# Patient Record
Sex: Male | Born: 1960 | Race: White | Hispanic: No | Marital: Married | Smoking: Former smoker
Health system: Southern US, Community
[De-identification: ages and names within clinical notes are randomized; demographics above are authoritative.]

## PROBLEM LIST (undated history)

## (undated) DIAGNOSIS — H269 Unspecified cataract: Secondary | ICD-10-CM

## (undated) DIAGNOSIS — R011 Cardiac murmur, unspecified: Secondary | ICD-10-CM

## (undated) DIAGNOSIS — K219 Gastro-esophageal reflux disease without esophagitis: Secondary | ICD-10-CM

## (undated) DIAGNOSIS — E785 Hyperlipidemia, unspecified: Secondary | ICD-10-CM

## (undated) DIAGNOSIS — A048 Other specified bacterial intestinal infections: Secondary | ICD-10-CM

## (undated) DIAGNOSIS — B019 Varicella without complication: Secondary | ICD-10-CM

## (undated) HISTORY — DX: Varicella without complication: B01.9

## (undated) HISTORY — PX: POLYPECTOMY: SHX149

## (undated) HISTORY — DX: Unspecified cataract: H26.9

## (undated) HISTORY — DX: Other specified bacterial intestinal infections: A04.8

## (undated) HISTORY — PX: KNEE ARTHROSCOPY: SUR90

## (undated) HISTORY — DX: Hyperlipidemia, unspecified: E78.5

## (undated) HISTORY — DX: Gastro-esophageal reflux disease without esophagitis: K21.9

## (undated) HISTORY — PX: COLONOSCOPY: SHX174

## (undated) HISTORY — DX: Cardiac murmur, unspecified: R01.1

---

## 2004-03-22 ENCOUNTER — Ambulatory Visit: Payer: Self-pay | Admitting: Family Medicine

## 2004-10-10 ENCOUNTER — Ambulatory Visit: Payer: Self-pay | Admitting: Family Medicine

## 2004-10-28 ENCOUNTER — Ambulatory Visit: Payer: Self-pay | Admitting: Family Medicine

## 2004-11-17 ENCOUNTER — Ambulatory Visit: Payer: Self-pay | Admitting: Pulmonary Disease

## 2006-04-25 ENCOUNTER — Ambulatory Visit: Payer: Self-pay | Admitting: Family Medicine

## 2006-04-25 LAB — CONVERTED CEMR LAB
ALT: 23 units/L (ref 0–40)
AST: 22 units/L (ref 0–37)
Basophils Relative: 0.7 % (ref 0.0–1.0)
Bilirubin, Direct: 0.1 mg/dL (ref 0.0–0.3)
CO2: 31 meq/L (ref 19–32)
Calcium: 9.6 mg/dL (ref 8.4–10.5)
Chloride: 104 meq/L (ref 96–112)
Creatinine, Ser: 1 mg/dL (ref 0.4–1.5)
Eosinophils Absolute: 0.1 10*3/uL (ref 0.0–0.6)
Eosinophils Relative: 2.1 % (ref 0.0–5.0)
GFR calc non Af Amer: 87 mL/min
Glucose, Bld: 97 mg/dL (ref 70–99)
HCT: 44.2 % (ref 39.0–52.0)
LDL Cholesterol: 110 mg/dL — ABNORMAL HIGH (ref 0–99)
MCV: 93.3 fL (ref 78.0–100.0)
Neutrophils Relative %: 41 % — ABNORMAL LOW (ref 43.0–77.0)
Platelets: 313 10*3/uL (ref 150–400)
RBC: 4.73 M/uL (ref 4.22–5.81)
RDW: 11.5 % (ref 11.5–14.6)
Sodium: 142 meq/L (ref 135–145)
TSH: 1.87 microintl units/mL (ref 0.35–5.50)
Total Bilirubin: 1.1 mg/dL (ref 0.3–1.2)
Total CHOL/HDL Ratio: 3.8
Triglycerides: 86 mg/dL (ref 0–149)
VLDL: 17 mg/dL (ref 0–40)
WBC: 5.5 10*3/uL (ref 4.5–10.5)

## 2006-05-02 ENCOUNTER — Ambulatory Visit: Payer: Self-pay | Admitting: Family Medicine

## 2006-08-21 ENCOUNTER — Ambulatory Visit: Payer: Self-pay | Admitting: Family Medicine

## 2007-04-02 ENCOUNTER — Telehealth: Payer: Self-pay | Admitting: Family Medicine

## 2007-09-09 ENCOUNTER — Telehealth: Payer: Self-pay | Admitting: Family Medicine

## 2007-12-02 ENCOUNTER — Telehealth: Payer: Self-pay | Admitting: Family Medicine

## 2008-01-20 ENCOUNTER — Ambulatory Visit: Payer: Self-pay | Admitting: Family Medicine

## 2008-01-20 LAB — CONVERTED CEMR LAB
Ketones, urine, test strip: NEGATIVE
Nitrite: NEGATIVE
Specific Gravity, Urine: 1.015
Urobilinogen, UA: 0.2
WBC Urine, dipstick: NEGATIVE

## 2008-01-22 LAB — CONVERTED CEMR LAB
ALT: 20 units/L (ref 0–53)
AST: 22 units/L (ref 0–37)
Alkaline Phosphatase: 39 units/L (ref 39–117)
BUN: 19 mg/dL (ref 6–23)
Bilirubin, Direct: 0.1 mg/dL (ref 0.0–0.3)
CO2: 29 meq/L (ref 19–32)
Chloride: 107 meq/L (ref 96–112)
Eosinophils Relative: 1.5 % (ref 0.0–5.0)
Glucose, Bld: 95 mg/dL (ref 70–99)
HDL: 50.7 mg/dL (ref >39.0)
LDL Cholesterol: 114 mg/dL — ABNORMAL HIGH (ref 0–99)
Lymphocytes Relative: 33.4 % (ref 12.0–46.0)
Monocytes Relative: 5.4 % (ref 3.0–12.0)
Platelets: 255 10*3/uL (ref 150–400)
Potassium: 5 meq/L (ref 3.5–5.1)
RDW: 11.9 % (ref 11.5–14.6)
Sodium: 143 meq/L (ref 135–145)
Total CHOL/HDL Ratio: 3.4
Total Protein: 6.9 g/dL (ref 6.0–8.3)
Triglycerides: 44 mg/dL (ref 0–149)
WBC: 6 10*3/uL (ref 4.5–10.5)

## 2008-01-27 ENCOUNTER — Ambulatory Visit: Payer: Self-pay | Admitting: Family Medicine

## 2008-01-27 DIAGNOSIS — K219 Gastro-esophageal reflux disease without esophagitis: Secondary | ICD-10-CM | POA: Insufficient documentation

## 2008-01-27 DIAGNOSIS — E785 Hyperlipidemia, unspecified: Secondary | ICD-10-CM | POA: Insufficient documentation

## 2008-01-27 DIAGNOSIS — Z8679 Personal history of other diseases of the circulatory system: Secondary | ICD-10-CM | POA: Insufficient documentation

## 2008-12-10 ENCOUNTER — Encounter: Payer: Self-pay | Admitting: Family Medicine

## 2010-07-29 NOTE — Assessment & Plan Note (Signed)
[facility] OFFICE NOTE   MELBOURNE, JAKUBIAK                     MRN:          [id number]  DATE:05/02/2006                            DOB:          02/14/1961    This is a 50 year old gentleman here for a complete physical exam.  He  has no particular complaints today, and is feeling well.  We treated him  for acid reflux disease in the past, and this is much better.  He has  changed his diet, and now only requires medication on an intermittent  basis.  About a year ago, we wondered if he may have some sleep apnea  due to excessive daytime sleepiness.  He saw the pulmonologist, and was  actually set up to have a sleep study, however, he had to cancel that,  and never actually rescheduled the sleep study.  However, he says he has  made some changes in his life, and has a lot less stress to deal with.  He now sleeps better at night, and has less sleepiness to deal with  during the daytime.   For other details of his past medical history, family history, social  history, habits, etc., refer to our last physical noted dated October 28, 2004.   ALLERGIES:  NONE.   CURRENT MEDICATIONS:  Protonix 40 mg once a day on an as-needed basis.   OBJECTIVE:  Height 5 feet 7 inches.  Weight 164.  BP 132/76.  Pulse 76  and regular.  GENERAL:  He appears to be healthy.  SKIN:  Clear.  Eyes are clear.  Ears are clear.  Pharynx clear.  NECK:  Supple without lymphadenopathy or masses.  LUNGS:  Clear.  CARDIAC:  Rate and rhythm regular without gallops, murmurs or rubs.  Distal pulses are full.  ABDOMEN:  Soft.  Normal bowel sounds.  Non-tender.  No masses.  GENITALIA:  Normal male.  EXTREMITIES:  No clubbing, cyanosis or edema.  NEUROLOGIC:  Exam is grossly intact.   He was here for fasting labs on April 25, 2006.  These were all  within normal limits except for his lipid panel.  LDL was mildly  elevated at 110, but  his HDL was excellent at 46.   ASSESSMENT AND PLAN:  1. Complete physical.  Encouraged him to get regular exercise.  2. Hyperlipidemia.  We talked about some dietary changes he could      make.  We will check this again in a year.  3. Gastroesophageal reflux disease.  Stable.    Tera Mater. Clent Ridges, MD  Electronically Signed   SAF/MedQ  DD: 05/02/2006  DT: 05/02/2006  Job #: [id number]

## 2012-06-03 ENCOUNTER — Encounter: Payer: Self-pay | Admitting: Family Medicine

## 2012-06-03 ENCOUNTER — Ambulatory Visit (INDEPENDENT_AMBULATORY_CARE_PROVIDER_SITE_OTHER): Payer: No Typology Code available for payment source | Admitting: Family Medicine

## 2012-06-03 VITALS — BP 130/70 | HR 80 | Temp 99.5°F | Resp 12 | Ht 66.5 in | Wt 167.0 lb

## 2012-06-03 DIAGNOSIS — R079 Chest pain, unspecified: Secondary | ICD-10-CM

## 2012-06-03 DIAGNOSIS — K219 Gastro-esophageal reflux disease without esophagitis: Secondary | ICD-10-CM

## 2012-06-03 MED ORDER — OMEPRAZOLE 40 MG PO CPDR: 40.0000 mg | DELAYED_RELEASE_CAPSULE | Freq: Every day | ORAL | Status: DC

## 2012-06-03 NOTE — Progress Notes (Signed)
  Subjective:    Patient ID: Darrell Romero, male    DOB: December 05, 1960, 52 y.o.   MRN: [id number]  HPI Patient here to establish care  Has not seen a physician several years. Recently had co-worker diagnosed with coronary artery disease in his 36s. Patient has noted for 6-8 months intermittent chest pains. These occasionally occur after exertion and sometimes at rest. Recently had one episode after climbing some stairs of chest pressure. Duration is usually one to 2 hours. Quality is frequently burning and severity is mild No radiation. Denies any associated dyspnea. No nausea. No vomiting. Has had some increased fatigue recently  Patient has history of GERD and years ago was treated with Protonix. He drinks approximately 2 pots of coffee per day Has recently taken TUMS without much relief. No dysphagia, appetite or weight change.  Nonsmoker. No family history of premature CAD. Mom apparently died of stroke complications at age 87. Previous lipids have been unremarkable. No history of diabetes or hypertension.  Past Medical History  Diagnosis Date  . GERD (gastroesophageal reflux disease)   . Hyperlipidemia   . Chicken pox    Past Surgical History  Procedure Laterality Date  . Knee arthroscopy  1998, 1999    left    reports that he has never smoked. He does not have any smokeless tobacco history on file. His alcohol and drug histories are not on file. family history includes Arthritis in his father; Heart disease in his paternal aunt; and Stroke (age of onset: 87) in his mother. Allergies  Allergen Reactions  . Protonix (Pantoprazole Sodium)        Review of Systems  Constitutional: Negative for appetite change and unexpected weight change.  HENT: Negative for trouble swallowing.   Respiratory: Negative for cough and shortness of breath.   Cardiovascular: Positive for chest pain. Negative for leg swelling.  Gastrointestinal: Negative for abdominal pain.  Neurological:  Negative for dizziness and syncope.  Hematological: Negative for adenopathy.       Objective:   Physical Exam  Constitutional: He appears well-developed and well-nourished.  Neck: Neck supple. No thyromegaly present.  Cardiovascular: Normal rate and regular rhythm.  Exam reveals no gallop.   No murmur heard. Pulmonary/Chest: Effort normal and breath sounds normal. No respiratory distress. He has no wheezes. He has no rales.  Abdominal: Soft. Bowel sounds are normal. He exhibits no distension and no mass. There is no tenderness. There is no rebound and no guarding.  Musculoskeletal: He exhibits no edema.          Assessment & Plan:  Chest pain. Suspect probably GERD related. Given recent exacerbation occasionally with exercise needs further evaluation. Check baseline EKG. Set up exercise stress test. Start back Protonix 40 mg daily.  Discussed need to gradually scale back caffeine (currently estimated 2 POTS/day)  EKG reveals sinus rhythm with no acute changes. Set up exercise tolerance test. Gradually scale back caffeine use

## 2012-06-03 NOTE — Patient Instructions (Addendum)
Diet for Gastroesophageal Reflux Disease, Adult Reflux (acid reflux) is when acid from your stomach flows up into the esophagus. When acid comes in contact with the esophagus, the acid causes irritation and soreness (inflammation) in the esophagus. When reflux happens often or so severely that it causes damage to the esophagus, it is called gastroesophageal reflux disease (GERD). Nutrition therapy can help ease the discomfort of GERD. FOODS OR DRINKS TO AVOID OR LIMIT  Smoking or chewing tobacco. Nicotine is one of the most potent stimulants to acid production in the gastrointestinal tract.  Caffeinated and decaffeinated coffee and black tea.  Regular or low-calorie carbonated beverages or energy drinks (caffeine-free carbonated beverages are allowed).   Strong spices, such as black pepper, white pepper, red pepper, cayenne, curry powder, and chili powder.  Peppermint or spearmint.  Chocolate.  High-fat foods, including meats and fried foods. Extra added fats including oils, butter, salad dressings, and nuts. Limit these to less than 8 tsp per day.  Fruits and vegetables if they are not tolerated, such as citrus fruits or tomatoes.  Alcohol.  Any food that seems to aggravate your condition. If you have questions regarding your diet, call your caregiver or a registered dietitian. OTHER THINGS THAT MAY HELP GERD INCLUDE:   Eating your meals slowly, in a relaxed setting.  Eating 5 to 6 small meals per day instead of 3 large meals.  Eliminating food for a period of time if it causes distress.  Not lying down until 3 hours after eating a meal.  Keeping the head of your bed raised 6 to 9 inches (15 to 23 cm) by using a foam wedge or blocks under the legs of the bed. Lying flat may make symptoms worse.  Being physically active. Weight loss may be helpful in reducing reflux in overweight or obese adults.  Wear loose fitting clothing EXAMPLE MEAL PLAN This meal plan is approximately  2,000 calories based on https://www.bernard.org/ meal planning guidelines. Breakfast   cup cooked oatmeal.  1 cup strawberries.  1 cup low-fat milk.  1 oz almonds. Snack  1 cup cucumber slices.  6 oz yogurt (made from low-fat or fat-free milk). Lunch  2 slice whole-wheat bread.  2 oz sliced [place].  2 tsp mayonnaise.  1 cup blueberries.  1 cup snap peas. Snack  6 whole-wheat crackers.  1 oz string cheese. Dinner   cup brown rice.  1 cup mixed veggies.  1 tsp olive oil.  3 oz grilled fish. Document Released: 02/27/2005 Document Revised: 05/22/2011 Document Reviewed: 01/13/2011 [facility] Patient Information 2013 [place], [place].  We will call you regarding stress test.

## 2012-06-07 ENCOUNTER — Other Ambulatory Visit (INDEPENDENT_AMBULATORY_CARE_PROVIDER_SITE_OTHER): Payer: No Typology Code available for payment source

## 2012-06-07 DIAGNOSIS — Z0000 Encounter for general adult medical examination without abnormal findings: Secondary | ICD-10-CM

## 2012-06-07 LAB — HEPATIC FUNCTION PANEL
ALT: 27 U/L (ref 0–53)
Albumin: 4.6 g/dL (ref 3.5–5.2)
Bilirubin, Direct: 0.1 mg/dL (ref 0.0–0.3)
Total Protein: 7.5 g/dL (ref 6.0–8.3)

## 2012-06-07 LAB — BASIC METABOLIC PANEL
BUN: 14 mg/dL (ref 6–23)
Calcium: 9.3 mg/dL (ref 8.4–10.5)
Creatinine, Ser: 1 mg/dL (ref 0.4–1.5)
GFR: 86.06 mL/min (ref >60.00)
Glucose, Bld: 96 mg/dL (ref 70–99)
Potassium: 4.8 mEq/L (ref 3.5–5.1)

## 2012-06-07 LAB — CBC WITH DIFFERENTIAL/PLATELET
Basophils Absolute: 0.1 10*3/uL (ref 0.0–0.1)
HCT: 43.2 % (ref 39.0–52.0)
Lymphs Abs: 2.3 10*3/uL (ref 0.7–4.0)
Monocytes Relative: 7.3 % (ref 3.0–12.0)
Neutrophils Relative %: 50.8 % (ref 43.0–77.0)
Platelets: 265 10*3/uL (ref 150.0–400.0)
RDW: 12.6 % (ref 11.5–14.6)

## 2012-06-07 LAB — POCT URINALYSIS DIPSTICK
Bilirubin, UA: NEGATIVE
Blood, UA: NEGATIVE
Leukocytes, UA: NEGATIVE
Nitrite, UA: NEGATIVE
Urobilinogen, UA: 0.2
pH, UA: 7.5

## 2012-06-07 LAB — LIPID PANEL
Cholesterol: 185 mg/dL (ref 0–200)
VLDL: 13.4 mg/dL (ref 0.0–40.0)

## 2012-06-12 NOTE — Progress Notes (Signed)
Quick Note:  Pt informed on VM ______ 

## 2012-06-13 ENCOUNTER — Encounter: Payer: Self-pay | Admitting: Family Medicine

## 2012-06-19 ENCOUNTER — Ambulatory Visit (INDEPENDENT_AMBULATORY_CARE_PROVIDER_SITE_OTHER): Payer: No Typology Code available for payment source | Admitting: Physician Assistant

## 2012-06-19 DIAGNOSIS — R079 Chest pain, unspecified: Secondary | ICD-10-CM

## 2012-06-19 NOTE — Progress Notes (Signed)
Darrell Romero is a 52 y.o. male referred by his PCP for ETT due to CP.  He has no hx of CAD, HTN, HL, DM2. Non-smoker.  No Fhx of CAD.  Exam unremarkable.    Exercise Treadmill Test  Pre-Exercise Testing Evaluation Rhythm: normal sinus  Rate: 80                 Test  Exercise Tolerance Test Ordering MD: Evelena Peat, MD  Interpreting MD: Tereso Newcomer, PA-C  Unique Test No: 1  Treadmill:  1  Indication for ETT: chest pain - rule out ischemia  Contraindication to ETT: No   Stress Modality: exercise - treadmill  Cardiac Imaging Performed: non   Protocol: standard Bruce - maximal  Max BP:  210/75  Max MPHR (bpm):  171 85% MPR (bpm):  145  MPHR obtained (bpm):  181 % MPHR obtained:  105  Reached 85% MPHR (min:sec):  3:36 Total Exercise Time (min-sec):  7:00  Workload in METS:  8.6 Borg Scale: 15  Reason ETT Terminated:  desired heart rate attained    ST Segment Analysis At Rest: normal ST segments - no evidence of significant ST depression With Exercise: non-specific ST changes  Other Information Arrhythmia:  No Angina during ETT:  absent (0) Quality of ETT:  diagnostic  ETT Interpretation:  normal - no evidence of ischemia by ST analysis  Comments: Fair exercise tolerance. No chest pain. Hypertensive BP response to exercise. Insignificant up-sloping ST depression.  No significant changes to suggest ischemia.   Recommendations: F/u with Kristian Covey, MD as directed. Signed, Tereso Newcomer, PA-C  12:20 PM 06/19/2012

## 2012-11-01 ENCOUNTER — Other Ambulatory Visit: Payer: Self-pay | Admitting: Family Medicine

## 2013-01-13 ENCOUNTER — Other Ambulatory Visit: Payer: Self-pay | Admitting: Family Medicine

## 2013-01-16 ENCOUNTER — Other Ambulatory Visit: Payer: Self-pay

## 2013-07-13 ENCOUNTER — Other Ambulatory Visit: Payer: Self-pay | Admitting: Family Medicine

## 2013-08-05 ENCOUNTER — Ambulatory Visit (INDEPENDENT_AMBULATORY_CARE_PROVIDER_SITE_OTHER): Payer: BC Managed Care – PPO | Admitting: Physician Assistant

## 2013-08-05 ENCOUNTER — Encounter: Payer: Self-pay | Admitting: Physician Assistant

## 2013-08-05 VITALS — BP 122/80 | HR 79 | Temp 98.7°F | Resp 16 | Wt 160.0 lb

## 2013-08-05 DIAGNOSIS — H109 Unspecified conjunctivitis: Secondary | ICD-10-CM

## 2013-08-05 DIAGNOSIS — J209 Acute bronchitis, unspecified: Secondary | ICD-10-CM

## 2013-08-05 MED ORDER — POLYMYXIN B-TRIMETHOPRIM 10000-0.1 UNIT/ML-% OP SOLN: 1.0000 [drp] | Freq: Four times a day (QID) | OPHTHALMIC | Status: DC

## 2013-08-05 NOTE — Progress Notes (Signed)
Pre visit review using our clinic review tool, if applicable. No additional management support is needed unless otherwise documented below in the visit note. 

## 2013-08-05 NOTE — Patient Instructions (Signed)
Polytrim eyedrops 1 drop into each eye 4 times a day for 7 days for conjunctivitis.  Continue Mucinex for thick secretions.  Increase fluids to maintain hydration.  Followup in one week to reassess. Or sooner if symptoms persist or worsen despite treatment.   Conjunctivitis Conjunctivitis is commonly called "pink eye." Conjunctivitis can be caused by bacterial or viral infection, allergies, or injuries. There is usually redness of the lining of the eye, itching, discomfort, and sometimes discharge. There may be deposits of matter along the eyelids. A viral infection usually causes a watery discharge, while a bacterial infection causes a yellowish, thick discharge. Pink eye is very contagious and spreads by direct contact. You may be given antibiotic eyedrops as part of your treatment. Before using your eye medicine, remove all drainage from the eye by washing gently with warm water and cotton balls. Continue to use the medication until you have awakened 2 mornings in a row without discharge from the eye. Do not rub your eye. This increases the irritation and helps spread infection. Use separate towels from other household members. Wash your hands with soap and water before and after touching your eyes. Use cold compresses to reduce pain and sunglasses to relieve irritation from light. Do not wear contact lenses or wear eye makeup until the infection is gone. SEEK MEDICAL CARE IF:   Your symptoms are not better after 3 days of treatment.  You have increased pain or trouble seeing.  The outer eyelids become very red or swollen. Document Released: 04/06/2004 Document Revised: 05/22/2011 Document Reviewed: 02/27/2005 [facility] Patient Information 2014 [place].

## 2013-08-05 NOTE — Progress Notes (Signed)
Subjective:    Patient ID: Darrell Romero, male    DOB: May 18, 1960, 53 y.o.   MRN: [id number]  Sinusitis This is a new problem. The current episode started in the past 7 days. The problem has been gradually improving since onset. There has been no fever. He is experiencing no pain. Associated symptoms include chills (last week- resolved), congestion (improving), coughing, a hoarse voice, sinus pressure and a sore throat. Pertinent negatives include no diaphoresis, ear pain, headaches, neck pain, shortness of breath, sneezing or swollen glands. Treatments tried: Allegra, Mucinex, NyQuil, DayQuil, Alka-Seltzer plus. The treatment provided mild relief.  Conjunctivitis  The current episode started 3 to 5 days ago. The onset was gradual. The problem has been gradually worsening. The problem is mild. Nothing relieves the symptoms. Nothing aggravates the symptoms. Associated symptoms include eye itching, congestion (improving), sore throat, cough, URI, eye discharge (clear/watery) and eye redness. Pertinent negatives include no orthopnea, no fever, no decreased vision, no double vision, no photophobia, no abdominal pain, no constipation, no diarrhea, no nausea, no vomiting, no ear discharge, no ear pain, no headaches, no hearing loss, no mouth sores, no rhinorrhea, no stridor, no swollen glands, no muscle aches, no neck pain, no neck stiffness, no wheezing, no rash, no diaper rash and no eye pain.    Review of Systems  Constitutional: Positive for chills (last week- resolved). Negative for fever and diaphoresis.  HENT: Positive for congestion (improving), hoarse voice, sinus pressure and sore throat. Negative for ear discharge, ear pain, hearing loss, mouth sores, rhinorrhea and sneezing.   Eyes: Positive for discharge (clear/watery), redness and itching. Negative for double vision, photophobia, pain and visual disturbance.  Respiratory: Positive for cough. Negative for shortness of breath, wheezing and  stridor.   Cardiovascular: Negative for orthopnea.  Gastrointestinal: Negative for nausea, vomiting, abdominal pain, diarrhea and constipation.  Musculoskeletal: Negative for neck pain.  Skin: Negative for rash.  Neurological: Negative for headaches.  All other systems reviewed and are negative.  Past Medical History  Diagnosis Date  . GERD (gastroesophageal reflux disease)   . Hyperlipidemia   . Chicken pox    Past Surgical History  Procedure Laterality Date  . Knee arthroscopy  1998, 1999    left    reports that he has never smoked. He does not have any smokeless tobacco history on file. His alcohol and drug histories are not on file. family history includes Arthritis in his father; Heart disease in his paternal aunt; Stroke (age of onset: 86) in his mother. Allergies  Allergen Reactions  . Protonix [Pantoprazole Sodium]       Objective:   Physical Exam  Nursing note and vitals reviewed. Constitutional: He is oriented to person, place, and time. He appears well-developed and well-nourished. No distress.  HENT:  Head: Normocephalic and atraumatic.  Eyes: EOM are normal. Pupils are equal, round, and reactive to light. Right eye exhibits discharge. Left eye exhibits discharge.  Bilateral retinal exam normal.  Bilateral eyes: The conjunctiva is mildly injected. There is clear discharge with mild matting in bilateral eyes. There is no periorbital swelling. Vision is grossly intact.  Left eye: There is a small resolving sub-conjunctival hemorrhage to the inferolateral portion of the conjunctiva.  Neck: Normal range of motion. Neck supple. No JVD present.  Cardiovascular: Normal rate, regular rhythm, normal heart sounds and intact distal pulses.  Exam reveals no gallop and no friction rub.   No murmur heard. Pulmonary/Chest: Effort normal. No stridor. No respiratory distress. He  has no wheezes. He has no rales. He exhibits no tenderness.  Mild rhonchi heard in bilateral lung  bases.  No crackles.  Musculoskeletal: Normal range of motion.  Lymphadenopathy:    He has no cervical adenopathy.  Neurological: He is alert and oriented to person, place, and time.  Skin: Skin is warm and dry. No rash noted. He is not diaphoretic. No erythema. No pallor.  Psychiatric: He has a normal mood and affect. His behavior is normal. Judgment and thought content normal.    Filed Vitals:   08/05/13 1420  BP: 122/80  Pulse: 79  Temp: 98.7 F (37.1 C)  Resp: 16     Lab Results  Component Value Date   WBC 5.9 06/07/2012   HGB 14.8 06/07/2012   HCT 43.2 06/07/2012   PLT 265.0 06/07/2012   GLUCOSE 96 06/07/2012   CHOL 185 06/07/2012   TRIG 67.0 06/07/2012   HDL 48.90 06/07/2012   LDLCALC 123* 06/07/2012   ALT 27 06/07/2012   AST 23 06/07/2012   NA 138 06/07/2012   K 4.8 06/07/2012   CL 102 06/07/2012   CREATININE 1.0 06/07/2012   BUN 14 06/07/2012   CO2 27 06/07/2012   TSH 2.15 06/07/2012      Assessment & Plan:  Darrell Romero was seen today for sinusitis and left eye redness.  Diagnoses and associated orders for this visit:  Conjunctivitis unspecified - Bilateral conjunctivitis. Left eye with a resolving subconjunctival hemorrhage.  - trimethoprim-polymyxin b (POLYTRIM) ophthalmic solution; Place 1 drop into both eyes every 6 (six) hours.  Acute bronchitis - Likely with resolving URI. - Pt will continue OTC symptomatic treatment with mucinex and increased fluids.  Plan to follow up in 1 week to reassess.   Patient Instructions  Polytrim eyedrops 1 drop into each eye 4 times a day for 7 days for conjunctivitis.  Continue Mucinex for thick secretions.  Increase fluids to maintain hydration.  Followup in one week to reassess. Or sooner if symptoms persist or worsen despite treatment.

## 2013-08-12 ENCOUNTER — Ambulatory Visit: Payer: BC Managed Care – PPO | Admitting: Physician Assistant

## 2013-09-02 ENCOUNTER — Ambulatory Visit (INDEPENDENT_AMBULATORY_CARE_PROVIDER_SITE_OTHER)
Admission: RE | Admit: 2013-09-02 | Discharge: 2013-09-02 | Disposition: A | Discharge status: Home / Self Care | Payer: BC Managed Care – PPO | Source: Ambulatory Visit | Attending: Internal Medicine | Admitting: Internal Medicine

## 2013-09-02 ENCOUNTER — Encounter: Payer: Self-pay | Admitting: Internal Medicine

## 2013-09-02 ENCOUNTER — Telehealth: Payer: Self-pay | Admitting: Family Medicine

## 2013-09-02 ENCOUNTER — Ambulatory Visit (INDEPENDENT_AMBULATORY_CARE_PROVIDER_SITE_OTHER): Payer: BC Managed Care – PPO | Admitting: Internal Medicine

## 2013-09-02 VITALS — BP 136/60 | Temp 99.5°F | Ht 66.0 in | Wt 157.0 lb

## 2013-09-02 DIAGNOSIS — R0789 Other chest pain: Secondary | ICD-10-CM | POA: Insufficient documentation

## 2013-09-02 DIAGNOSIS — K219 Gastro-esophageal reflux disease without esophagitis: Secondary | ICD-10-CM

## 2013-09-02 NOTE — Telephone Encounter (Signed)
Patient Information:  Caller Name: Darrell Romero  Phone: [phone]  Patient: Darrell Romero, Darrell Romero  Gender: Male  DOB: 08-23-1960  Age: 53 Years  PCP: Carolann Littler [facility])  Office Follow Up:  Does the office need to follow up with this patient?: No  Instructions For The Office: N/A  RN Note:  Spouse calling regarding Patient/Darrell Romero who has had chest "burning" for past few days.  States "He denies pain, and calls it burning, but it will not go away with medicine."  Symptoms  Reason For Call & Symptoms: c/o burning in chest  Reviewed Health History In EMR: Yes  Reviewed Medications In EMR: Yes  Reviewed Allergies In EMR: Yes  Reviewed Surgeries / Procedures: Yes  Date of Onset of Symptoms: 08/31/2013  Treatments Tried: Zantac & tums  Treatments Tried Worked: No  Guideline(s) Used:  Chest Pain  Disposition Per Guideline:   See Today in Office  Reason For Disposition Reached:   Intermittent chest pains persist > 3 days  Advice Given:  Call Back If:  Severe chest pain  You become worse.  Patient Will Follow Care Advice:  YES  Appointment Scheduled:  09/02/2013 15:45:00 Appointment Scheduled Provider:  Shanon Ace [facility])

## 2013-09-02 NOTE — Telephone Encounter (Signed)
Noted  

## 2013-09-02 NOTE — Progress Notes (Signed)
Chief Complaint  Patient presents with  . Chest Discomfort    Has a "burning" sensation in his chest.    HPI: Patient comes in today for SDA for  Worsening  problem evaluation.  pcp out of office this afternoon and wife contacted CAN. made urgent appt  .  Chest discomfort for awhile  Mid chest  Burning and ache  Waxes and wanes .  Feel when yawns deepl   Taking Prilosec   Also tums   Had helped in the past  But coming and worse recently  Then friends wife had  Cancer and worried. To get checked  Worse straining  Some foods aggravated . activity exercise aggravates  Had ett last year and now risk but ht response to exercise  caffiene :  Coffee  About 1 pot per day  In week 2 in  Alcohol:  Used to do beer daily but now only on weekends. Little. No sodas. No nocturnal sx. Breathing ok.   ROS: See pertinent positives and negatives per HPI.no weight loss sob had rep infection and"bronchirtis' residual cough  No asthma wheezing. Works  Clinical cytogeneticist  Sometimes fumes wears respirator remote hx of tobacco teen to 88s .   Past Medical History  Diagnosis Date  . GERD (gastroesophageal reflux disease)   . Hyperlipidemia   . Chicken pox    [place] not a lot of family hx  Family History  Problem Relation Age of Onset  . Arthritis Father   . Heart disease Paternal Aunt   . Stroke Mother 72    died of stroke complications    History   Social History  . Marital Status: Married    Spouse Name: N/A    Number of Children: N/A  . Years of Education: N/A   Social History Main Topics  . Smoking status: Never Smoker   . Smokeless tobacco: None  . Alcohol Use: None  . Drug Use: None  . Sexual Activity: None   Other Topics Concern  . None   Social History Narrative  . None    Outpatient Encounter Prescriptions as of 09/02/2013  Medication Sig  . omeprazole (PRILOSEC) 40 MG capsule TAKE 1 CAPSULE (40 MG TOTAL) BY MOUTH DAILY.  Marland Kitchen omeprazole (PRILOSEC) 40 MG capsule TAKE  ONE CAPSULE BY MOUTH DAILY  . [DISCONTINUED] famciclovir (FAMVIR) 500 MG tablet Take 500 mg by mouth as needed.  . [DISCONTINUED] trimethoprim-polymyxin b (POLYTRIM) ophthalmic solution Place 1 drop into both eyes every 6 (six) hours.    EXAM:  BP 136/60  Temp(Src) 99.5 F (37.5 C) (Oral)  Ht 5\' 6"  (1.676 m)  Wt 157 lb (71.215 kg)  BMI 25.35 kg/m2  Body mass index is 25.35 kg/(m^2).  GENERAL: vitals reviewed and listed above, alert, oriented, appears well hydrated and in no acute distress HEENT: atraumatic, conjunctiva  clear, no obvious abnormalities on inspection of external nose and ears OP : no lesion edema or exudate  NECK: no obvious masses on inspection palpation  LUNGS: clear to auscultation bilaterally, no wheezes, rales or rhonchi, good air movement CV: HRRR, no clubbing cyanosis or  peripheral edema nl cap refill  Abdomen:  Sof,t normal bowel sounds without hepatosplenomegaly, no guarding rebound or masses no CVA tenderness MS: moves all extremities without noticeable focal  abnormality PSYCH: pleasant and cooperative, no obvious depression or anxiety skin non icteric  EKG nsr  No acute changes  ASSESSMENT AND PLAN:  Discussed the following assessment and plan:  Chest discomfort - concern about esoph disease  ? endo vs other  - Plan: EKG 12-Lead, DG Chest 2 View  Gastroesophageal reflux disease, esophagitis presence not specified Long standing sx getting worse  Atypical advise further work up. ( to go out of country to [place]  July 20th for work) -Patient advised to return or notify health care team  if symptoms worsen ,persist or new concerns arise.  Patient Instructions  Get chest x ray to be sure clear. Lungs are clear today. ekg shows no abnormalities . I think you should see a gastroenterologist possible needing an endoscopy. I can  Get information to Dr [name] about referral.  In the short run can try to use nexium instead of  The prilosec  But uncertain  if that will help that much  Stop the tums and add ranitidine 150 mg twice a day .  Decrease caffeine .   Food Choices for Gastroesophageal Reflux Disease When you have gastroesophageal reflux disease (GERD), the foods you eat and your eating habits are very important. Choosing the right foods can help ease the discomfort of GERD. WHAT GENERAL GUIDELINES DO I NEED TO FOLLOW?  Choose fruits, vegetables, whole grains, low-fat dairy products, and low-fat meat, fish, and poultry.  Limit fats such as oils, salad dressings, butter, nuts, and avocado.  Keep a food diary to identify foods that cause symptoms.  Avoid foods that cause reflux. These may be different for different people.  Eat frequent small meals instead of three large meals each day.  Eat your meals slowly, in a relaxed setting.  Limit fried foods.  Cook foods using methods other than frying.  Avoid drinking alcohol.  Avoid drinking large amounts of liquids with your meals.  Avoid bending over or lying down until 2-3 hours after eating. WHAT FOODS ARE NOT RECOMMENDED? The following are some foods and drinks that may worsen your symptoms: Vegetables Tomatoes. Tomato juice. Tomato and spaghetti sauce. Chili peppers. Onion and garlic. Horseradish. Fruits Oranges, grapefruit, and lemon (fruit and juice). Meats High-fat meats, fish, and poultry. This includes hot dogs, ribs, ham, sausage, salami, and bacon. Dairy Whole milk and chocolate milk. Sour cream. Cream. Butter. Ice cream. Cream cheese.  Beverages Coffee and tea, with or without caffeine. Carbonated beverages or energy drinks. Condiments Hot sauce. Barbecue sauce.  Sweets/Desserts Chocolate and cocoa. Donuts. Peppermint and spearmint. Fats and Oils High-fat foods, including [place] fries and potato chips. Other Vinegar. Strong spices, such as black pepper, white pepper, red pepper, cayenne, curry powder, cloves, ginger, and chili powder. The items listed  above may not be a complete list of foods and beverages to avoid. Contact your dietitian for more information. Document Released: 02/27/2005 Document Revised: 03/04/2013 Document Reviewed: 01/01/2013 [facility] Patient Information 2015 [place], [place]. This information is not intended to replace advice given to you by your health care provider. Make sure you discuss any questions you have with your health care provider.    Standley Brooking. Panosh M.D.  Pre visit review using our clinic review tool, if applicable. No additional management support is needed unless otherwise documented below in the visit note.

## 2013-09-02 NOTE — Patient Instructions (Addendum)
Get chest x ray to be sure clear. Lungs are clear today. ekg shows no abnormalities . I think you should see a gastroenterologist possible needing an endoscopy. I can  Get information to Dr [name] about referral.  In the short run can try to use nexium instead of  The prilosec  But uncertain if that will help that much  Stop the tums and add ranitidine 150 mg twice a day .  Decrease caffeine .   Food Choices for Gastroesophageal Reflux Disease When you have gastroesophageal reflux disease (GERD), the foods you eat and your eating habits are very important. Choosing the right foods can help ease the discomfort of GERD. WHAT GENERAL GUIDELINES DO I NEED TO FOLLOW?  Choose fruits, vegetables, whole grains, low-fat dairy products, and low-fat meat, fish, and poultry.  Limit fats such as oils, salad dressings, butter, nuts, and avocado.  Keep a food diary to identify foods that cause symptoms.  Avoid foods that cause reflux. These may be different for different people.  Eat frequent small meals instead of three large meals each day.  Eat your meals slowly, in a relaxed setting.  Limit fried foods.  Cook foods using methods other than frying.  Avoid drinking alcohol.  Avoid drinking large amounts of liquids with your meals.  Avoid bending over or lying down until 2-3 hours after eating. WHAT FOODS ARE NOT RECOMMENDED? The following are some foods and drinks that may worsen your symptoms: Vegetables Tomatoes. Tomato juice. Tomato and spaghetti sauce. Chili peppers. Onion and garlic. Horseradish. Fruits Oranges, grapefruit, and lemon (fruit and juice). Meats High-fat meats, fish, and poultry. This includes hot dogs, ribs, ham, sausage, salami, and bacon. Dairy Whole milk and chocolate milk. Sour cream. Cream. Butter. Ice cream. Cream cheese.  Beverages Coffee and tea, with or without caffeine. Carbonated beverages or energy drinks. Condiments Hot sauce. Barbecue sauce.   Sweets/Desserts Chocolate and cocoa. Donuts. Peppermint and spearmint. Fats and Oils High-fat foods, including [place] fries and potato chips. Other Vinegar. Strong spices, such as black pepper, white pepper, red pepper, cayenne, curry powder, cloves, ginger, and chili powder. The items listed above may not be a complete list of foods and beverages to avoid. Contact your dietitian for more information. Document Released: 02/27/2005 Document Revised: 03/04/2013 Document Reviewed: 01/01/2013 [facility] Patient Information 2015 [place], [place]. This information is not intended to replace advice given to you by your health care Harvy Riera. Make sure you discuss any questions you have with your health care Selwyn Reason.

## 2013-09-02 NOTE — Progress Notes (Signed)
Quick Note:  Tell patient that x ray shows no acute abnormality. reassuring ______

## 2013-09-03 ENCOUNTER — Telehealth: Payer: Self-pay

## 2013-09-03 NOTE — Telephone Encounter (Signed)
Can you please call patient and set up a follow up appointment, within 2 weeks. Thank you

## 2013-09-03 NOTE — Telephone Encounter (Signed)
appt scheduled 09/22/13 at 4:15pm

## 2013-09-22 ENCOUNTER — Ambulatory Visit (INDEPENDENT_AMBULATORY_CARE_PROVIDER_SITE_OTHER): Payer: BC Managed Care – PPO | Admitting: Family Medicine

## 2013-09-22 ENCOUNTER — Encounter: Payer: Self-pay | Admitting: Family Medicine

## 2013-09-22 VITALS — BP 130/68 | HR 80 | Wt 159.0 lb

## 2013-09-22 DIAGNOSIS — K219 Gastro-esophageal reflux disease without esophagitis: Secondary | ICD-10-CM

## 2013-09-22 DIAGNOSIS — R634 Abnormal weight loss: Secondary | ICD-10-CM

## 2013-09-22 NOTE — Patient Instructions (Signed)
Gastroesophageal Reflux Disease, Adult Gastroesophageal reflux disease (GERD) happens when acid from your stomach flows up into the esophagus. When acid comes in contact with the esophagus, the acid causes soreness (inflammation) in the esophagus. Over time, GERD may create small holes (ulcers) in the lining of the esophagus. CAUSES   Increased body weight. This puts pressure on the stomach, making acid rise from the stomach into the esophagus.  Smoking. This increases acid production in the stomach.  Drinking alcohol. This causes decreased pressure in the lower esophageal sphincter (valve or ring of muscle between the esophagus and stomach), allowing acid from the stomach into the esophagus.  Late evening meals and a full stomach. This increases pressure and acid production in the stomach.  A malformed lower esophageal sphincter. Sometimes, no cause is found. SYMPTOMS   Burning pain in the lower part of the mid-chest behind the breastbone and in the mid-stomach area. This may occur twice a week or more often.  Trouble swallowing.  Sore throat.  Dry cough.  Asthma-like symptoms including chest tightness, shortness of breath, or wheezing. DIAGNOSIS  Your caregiver may be able to diagnose GERD based on your symptoms. In some cases, X-rays and other tests may be done to check for complications or to check the condition of your stomach and esophagus. TREATMENT  Your caregiver may recommend over-the-counter or prescription medicines to help decrease acid production. Ask your caregiver before starting or adding any new medicines.  HOME CARE INSTRUCTIONS   Change the factors that you can control. Ask your caregiver for guidance concerning weight loss, quitting smoking, and alcohol consumption.  Avoid foods and drinks that make your symptoms worse, such as:  Caffeine or alcoholic drinks.  Chocolate.  Peppermint or mint flavorings.  Garlic and onions.  Spicy foods.  Citrus fruits,  such as oranges, lemons, or limes.  Tomato-based foods such as sauce, chili, salsa, and pizza.  Fried and fatty foods.  Avoid lying down for the 3 hours prior to your bedtime or prior to taking a nap.  Eat small, frequent meals instead of large meals.  Wear loose-fitting clothing. Do not wear anything tight around your waist that causes pressure on your stomach.  Raise the head of your bed 6 to 8 inches with wood blocks to help you sleep. Extra pillows will not help.  Only take over-the-counter or prescription medicines for pain, discomfort, or fever as directed by your caregiver.  Do not take aspirin, ibuprofen, or other nonsteroidal anti-inflammatory drugs (NSAIDs). SEEK IMMEDIATE MEDICAL CARE IF:   You have pain in your arms, neck, jaw, teeth, or back.  Your pain increases or changes in intensity or duration.  You develop nausea, vomiting, or sweating (diaphoresis).  You develop shortness of breath, or you faint.  Your vomit is green, yellow, black, or looks like coffee grounds or blood.  Your stool is red, bloody, or black. These symptoms could be signs of other problems, such as heart disease, gastric bleeding, or esophageal bleeding. MAKE SURE YOU:   Understand these instructions.  Will watch your condition.  Will get help right away if you are not doing well or get worse. Document Released: 12/07/2004 Document Revised: 05/22/2011 Document Reviewed: 09/16/2010 [facility] Patient Information 2015 [place], [place]. This information is not intended to replace advice given to you by your health care provider. Make sure you discuss any questions you have with your health care provider.  Stay on current medications. We will call you with GI referral.

## 2013-09-22 NOTE — Progress Notes (Signed)
Pre visit review using our clinic review tool, if applicable. No additional management support is needed unless otherwise documented below in the visit note. 

## 2013-09-22 NOTE — Progress Notes (Signed)
   Subjective:    Patient ID: Darrell Romero, male    DOB: 1961-03-06, 53 y.o.   MRN: [id number]  HPI Patient seen with some persistent substernal discomfort. Refer to prior note. He has long history of GERD. He had been taking Prilosec inconsistently but over the past month has taken 40 mg every day. Recent addition of Zantac 150 mg twice a day. He has seen slight improvement but far from resolution. He has substernal symptoms that are relatively constant and sometimes exacerbated by things like beer. He does not drink alcohol regularly. No nonsteroidal use.  He denies any exertional symptoms or chest pressure or heaviness whatsoever. No dyspnea. No appetite changes. He has loss some weight since last year (8 pounds) but was not aware of any weight loss. He denies any odontophagia or dysphagia. No melena. Significant caffeine use. Nonsmoker  Recent EKG and chest x-ray unremarkable.  Past Medical History  Diagnosis Date  . GERD (gastroesophageal reflux disease)   . Hyperlipidemia   . Chicken pox    Past Surgical History  Procedure Laterality Date  . Knee arthroscopy  1998, 1999    left    reports that he has never smoked. He does not have any smokeless tobacco history on file. His alcohol and drug histories are not on file. family history includes Arthritis in his father; Heart disease in his paternal aunt; Stroke (age of onset: 41) in his mother. Allergies  Allergen Reactions  . Protonix [Pantoprazole Sodium]       Review of Systems  Constitutional: Negative for appetite change and unexpected weight change.  HENT: Negative for trouble swallowing.   Respiratory: Negative for cough and shortness of breath.   Gastrointestinal: Negative for nausea, vomiting, abdominal pain and blood in stool.       Objective:   Physical Exam  Constitutional: He appears well-developed and well-nourished.  HENT:  Mouth/Throat: Oropharynx is clear and moist.  Neck: Neck supple. No thyromegaly  present.  Cardiovascular: Normal rate and regular rhythm.  Exam reveals no gallop.   Pulmonary/Chest: Effort normal and breath sounds normal. No respiratory distress. He has no wheezes. He has no rales.  Abdominal: Soft. Bowel sounds are normal. He exhibits no distension and no mass. There is no tenderness. There is no rebound and no guarding.  Lymphadenopathy:    He has no cervical adenopathy.          Assessment & Plan:  GERD with substernal discomfort. Patient has had some improvement but persistent symptoms on chronic PPI and addition of H2 blocker. He has had about 8 pounds of weight loss since last year but he was not aware of any weight changes and denies any appetite change. His symptoms do not in any way sound cardiac. Needs further evaluation. Set up GI referral. Of note, he has also not had previous screening colonoscopy.

## 2013-09-26 ENCOUNTER — Encounter: Payer: Self-pay | Admitting: Internal Medicine

## 2013-12-04 ENCOUNTER — Encounter: Payer: Self-pay | Admitting: Internal Medicine

## 2013-12-04 ENCOUNTER — Ambulatory Visit (INDEPENDENT_AMBULATORY_CARE_PROVIDER_SITE_OTHER): Payer: BC Managed Care – PPO | Admitting: Internal Medicine

## 2013-12-04 ENCOUNTER — Encounter: Payer: Self-pay | Admitting: Family Medicine

## 2013-12-04 VITALS — BP 120/70 | HR 60 | Ht 66.0 in | Wt 157.2 lb

## 2013-12-04 DIAGNOSIS — R0789 Other chest pain: Secondary | ICD-10-CM

## 2013-12-04 DIAGNOSIS — K219 Gastro-esophageal reflux disease without esophagitis: Secondary | ICD-10-CM

## 2013-12-04 DIAGNOSIS — Z1211 Encounter for screening for malignant neoplasm of colon: Secondary | ICD-10-CM

## 2013-12-04 MED ORDER — MOVIPREP 100 G PO SOLR: 1.0000 | Freq: Once | ORAL | Status: DC

## 2013-12-04 NOTE — Patient Instructions (Signed)
You have been scheduled for an endoscopy and colonoscopy. Please follow the written instructions given to you at your visit today. Please pick up your prep at the pharmacy within the next 1-3 days. If you use inhalers (even only as needed), please bring them with you on the day of your procedure. Your physician has requested that you go to www.startemmi.com and enter the access code given to you at your visit today. This web site gives a general overview about your procedure. However, you should still follow specific instructions given to you by our office regarding your preparation for the procedure. Take you prilosec every morning and ranitidine at night. CC:  Darrell Littler MD

## 2013-12-04 NOTE — Progress Notes (Signed)
Patient ID: Darrell Romero, male   DOB: [birth year], 53 y.o.   MRN: [id number] HPI: Darrell Romero is a 53 yo male with PMH of GERD, atypical CP, and HL who is seen in consultation at the request of Dr. [name] to evaluate atypical CP.  The is here alone today.  He reports a history of burning and aching chest pain that has been present over the last year. He reports is more aching and burning. Several years ago he was treated with Protonix initially twice a day for heartburn and chest pain. Symptoms got better and PPI was discontinued. He had recurrence of symptoms about a year ago, particularly aching chest pain and was again started on PPI and later twice daily ranitidine was added without much benefit. He was taken omeprazole 40 mg daily and Zantac 150 mg twice daily but now he is only taking it once daily. Pain does not feel like typical heartburn. Worse with exertion. Not worse with eating always. He feels like he is "getting used to the pain". No nausea, dysphagia or odynophagia. He has lost about 8 pounds somewhat unintentionally. Certain foods make it worse like beer, but spicy foods and juices don't bother it. Bowel movements can be inconsistent but this is not a change. No black stool or blood in his stool. He's never had colonoscopy. No known family history of GI malignancy.  In 2014 he had a cardiology evaluation including treadmill stress test which was reportedly normal.  Past Medical History  Diagnosis Date  . GERD (gastroesophageal reflux disease)   . Hyperlipidemia   . Chicken pox     Past Surgical History  Procedure Laterality Date  . Knee arthroscopy Left 1998, 1999    Outpatient Prescriptions Prior to Visit  Medication Sig Dispense Refill  . omeprazole (PRILOSEC) 40 MG capsule TAKE 1 CAPSULE (40 MG TOTAL) BY MOUTH DAILY.  30 capsule  5  . ranitidine (ZANTAC) 150 MG capsule Take 150 mg by mouth 2 (two) times daily.       No facility-administered medications prior to visit.     Allergies  Allergen Reactions  . Protonix [Pantoprazole Sodium]     Family History  Problem Relation Age of Onset  . Arthritis Father   . Heart disease Paternal Aunt   . Stroke Mother 26    died of stroke complications    History  Substance Use Topics  . Smoking status: Former Smoker -- 6 years    Types: Cigarettes    Quit date: 03/13/1982  . Smokeless tobacco: Not on file  . Alcohol Use: Yes     Comment: 2-3 on the weekends only    ROS: As per history of present illness, otherwise negative  BP 120/70  Pulse 60  Ht 5\' 6"  (1.676 m)  Wt 157 lb 4 oz (71.328 kg)  BMI 25.39 kg/m2 Constitutional: Well-developed and well-nourished. No distress. HEENT: Normocephalic and atraumatic. Oropharynx is clear and moist. No oropharyngeal exudate. Conjunctivae are normal.  No scleral icterus. Neck: Neck supple. Trachea midline. Cardiovascular: Normal rate, regular rhythm and intact distal pulses. No M/R/G. mild tenderness anterior chest right and left of sternum but this is not a character of his usual pain Pulmonary/chest: Effort normal and breath sounds normal. No wheezing, rales or rhonchi. Abdominal: Soft, nontender, nondistended. Bowel sounds active throughout. There are no masses palpable. No hepatosplenomegaly. Extremities: no clubbing, cyanosis, or edema Lymphadenopathy: No cervical adenopathy noted. Neurological: Alert and oriented to person place and time. Skin: Skin is  warm and dry. No rashes noted. Psychiatric: Normal mood and affect. Behavior is normal.  RELEVANT LABS AND IMAGING: CBC    Component Value Date/Time   WBC 5.9 06/07/2012 1031   RBC 4.69 06/07/2012 1031   HGB 14.8 06/07/2012 1031   HCT 43.2 06/07/2012 1031   PLT 265.0 06/07/2012 1031   MCV 92.0 06/07/2012 1031   MCHC 34.3 06/07/2012 1031   RDW 12.6 06/07/2012 1031   LYMPHSABS 2.3 06/07/2012 1031   MONOABS 0.4 06/07/2012 1031   EOSABS 0.1 06/07/2012 1031   BASOSABS 0.1 06/07/2012 1031    CMP      Component Value Date/Time   NA 138 06/07/2012 1031   K 4.8 06/07/2012 1031   CL 102 06/07/2012 1031   CO2 27 06/07/2012 1031   GLUCOSE 96 06/07/2012 1031   BUN 14 06/07/2012 1031   CREATININE 1.0 06/07/2012 1031   CALCIUM 9.3 06/07/2012 1031   PROT 7.5 06/07/2012 1031   ALBUMIN 4.6 06/07/2012 1031   AST 23 06/07/2012 1031   ALT 27 06/07/2012 1031   ALKPHOS 42 06/07/2012 1031   BILITOT 1.0 06/07/2012 1031   GFRNONAA 86 01/20/2008 0844   GFRAA 104 01/20/2008 0844    ASSESSMENT/PLAN:  53 yo male with PMH of GERD, atypical CP, and HL who is seen in consultation at the request of Dr. [name] to evaluate atypical CP.    1. 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 My suspicion for uncontrolled reflux is low given that it has not responded to traditional therapy and he doesn't feel true heartburn. Possibility of esophageal spasm. I recommended upper endoscopy for direct visualization. If unremarkable would consider manometry. Previous cardiology workup unrevealing. Continue omeprazole 40 mg in the morning and ranitidine 150 mg in the evening for now.  2. CRC screening -- I recommended screening colonoscopy. We discussed both tests and he is agreeable to proceed. They could be performed at the same time.

## 2013-12-05 ENCOUNTER — Other Ambulatory Visit: Payer: Self-pay

## 2013-12-05 MED ORDER — FAMCICLOVIR 500 MG PO TABS: 500.0000 mg | ORAL_TABLET | ORAL | Status: DC | PRN

## 2013-12-15 ENCOUNTER — Encounter: Payer: Self-pay | Admitting: Internal Medicine

## 2014-01-27 ENCOUNTER — Encounter: Payer: Self-pay | Admitting: Internal Medicine

## 2014-01-27 ENCOUNTER — Ambulatory Visit (AMBULATORY_SURGERY_CENTER): Payer: BC Managed Care – PPO | Admitting: Internal Medicine

## 2014-01-27 VITALS — BP 119/72 | HR 69 | Temp 96.8°F | Resp 41 | Ht 66.0 in | Wt 157.0 lb

## 2014-01-27 DIAGNOSIS — R0789 Other chest pain: Secondary | ICD-10-CM

## 2014-01-27 DIAGNOSIS — K2950 Unspecified chronic gastritis without bleeding: Secondary | ICD-10-CM

## 2014-01-27 DIAGNOSIS — Z1211 Encounter for screening for malignant neoplasm of colon: Secondary | ICD-10-CM

## 2014-01-27 DIAGNOSIS — K219 Gastro-esophageal reflux disease without esophagitis: Secondary | ICD-10-CM

## 2014-01-27 DIAGNOSIS — R079 Chest pain, unspecified: Secondary | ICD-10-CM

## 2014-01-27 DIAGNOSIS — D128 Benign neoplasm of rectum: Secondary | ICD-10-CM

## 2014-01-27 DIAGNOSIS — B9681 Helicobacter pylori [H. pylori] as the cause of diseases classified elsewhere: Secondary | ICD-10-CM

## 2014-01-27 DIAGNOSIS — D129 Benign neoplasm of anus and anal canal: Secondary | ICD-10-CM

## 2014-01-27 MED ORDER — SODIUM CHLORIDE 0.9 % IV SOLN: 500.0000 mL | INTRAVENOUS | Status: DC

## 2014-01-27 NOTE — Progress Notes (Signed)
Called to room to assist during endoscopic procedure.  Patient ID and intended procedure confirmed with present staff. Received instructions for my participation in the procedure from the performing physician.  

## 2014-01-27 NOTE — Progress Notes (Signed)
Report to PACU, RN, vss, BBS= Clear.  

## 2014-01-27 NOTE — Op Note (Signed)
[place]  [address] [place], [id number]   ENDOSCOPY PROCEDURE REPORT  PATIENT: [name], [name]  MR#: [id number] BIRTHDATE: 04/11/1960 , 53  yrs. old GENDER: male ENDOSCOPIST: Jerene Bears, MD REFERRED BY:  Carolann Littler, M.D. PROCEDURE DATE:  01/27/2014 PROCEDURE:  EGD w/ biopsy ASA CLASS:     Class II INDICATIONS:  chest pain. MEDICATIONS: Monitored anesthesia care and Propofol 200 mg IV, lidocaine 150 mg IV TOPICAL ANESTHETIC: none  DESCRIPTION OF PROCEDURE: After the risks benefits and alternatives of the procedure were thoroughly explained, informed consent was obtained.  The LB [id number] endoscope was introduced through the mouth and advanced to the second portion of the duodenum , Without limitations.  The instrument was slowly withdrawn as the mucosa was fully examined.  ESOPHAGUS: Possible eosinophilic esophagitis with mucosal changes that included white spots and longitudinal markings was found most prominent in the mid and distal esophagus.  Biopsies were taken in the distal, mid, and proximal esophagus for eosinophilic esophagitis.   No stricture was seen.  Regular z-line.  STOMACH: Mild gastropathy was found in the gastric fundus and cardia.  Cold forcep biopsies were taken at the gastric body, antrum and angularis to evaluate for h.  pylori.  DUODENUM: The duodenal mucosa showed no abnormalities in the bulb and 2nd part of the duodenum.  Retroflexed views revealed no abnormalities.     The scope was then withdrawn from the patient and the procedure completed.  COMPLICATIONS: There were no immediate complications.  ENDOSCOPIC IMPRESSION: 1.  Possible eosinophilic esophagitis was found; Biopsies were taken in the distal, mid, and proximal esophagus for eosinophilic esophagitis 2.   Mild gastropathy was found in the gastric fundus and cardia 3.   The duodenal mucosa showed no abnormalities in the bulb and 2nd part of the  duodenum  RECOMMENDATIONS: 1.  Await biopsy results 2.  Continue current medications  eSigned:  Jerene Bears, MD 01/27/2014 3:41 PM  [id number] [name], MD and The Patient

## 2014-01-27 NOTE — Op Note (Signed)
[place]  [address] [place], [address]   COLONOSCOPY PROCEDURE REPORT  PATIENT: Darrell Romero, Darrell Romero  MR#: [id number] BIRTHDATE: 1960/05/16 , 53  yrs. old GENDER: male ENDOSCOPIST: Jerene Bears, MD REFERRED OZ:HYQMV Burchette, M.D. PROCEDURE DATE:  01/27/2014 PROCEDURE:   Colonoscopy with snare polypectomy First Screening Colonoscopy - Avg.  risk and is 50 yrs.  old or older Yes.  Prior Negative Screening - Now for repeat screening. N/A  History of Adenoma - Now for follow-up colonoscopy & has been > or = to 3 yrs.  N/A  Polyps Removed Today? Yes. ASA CLASS:   Class II INDICATIONS:average risk for colorectal cancer and first colonoscopy. MEDICATIONS: Monitored anesthesia care, Propofol 100 mg IV, and Residual sedation present  DESCRIPTION OF PROCEDURE:   After the risks benefits and alternatives of the procedure were thoroughly explained, informed consent was obtained.  The digital rectal exam revealed no rectal mass.   The LB [id number]  endoscope was introduced through the anus and advanced to the cecum, which was identified by both the appendix and ileocecal valve. No adverse events experienced. The quality of the prep was good, using MoviPrep  The instrument was then slowly withdrawn as the colon was fully examined.   COLON FINDINGS: Two sessile polyps ranging from 5 to 3mm in size were found in the rectum.  Polypectomies were performed using snare cautery.  The resection was complete, the polyp tissue was completely retrieved and sent to histology.   There was mild diverticulosis noted in the sigmoid colon.  Retroflexed views revealed no abnormalities. The time to cecum=4 minutes 19 seconds. Withdrawal time=10 minutes 47 seconds.  The scope was withdrawn and the procedure completed. COMPLICATIONS: There were no immediate complications.  ENDOSCOPIC IMPRESSION: 1.   Two sessile polyps ranging from 5 to 55mm in size were found in the rectum;  polypectomies were performed using snare cautery 2.   Mild diverticulosis was noted in the sigmoid colon  RECOMMENDATIONS: 1.  Avoid all NSAIDS for the next 2 weeks. 2.  If the polyps removed today are proven to be adenomatous (pre-cancerous) polyps, you will need a repeat colonoscopy in 5 years.  Otherwise you should continue to follow colorectal cancer screening guidelines for "routine risk" patients with colonoscopy in 10 years.  You will receive a letter within 1-2 weeks with the results of your biopsy as well as final recommendations.  Please call my office if you have not received a letter after 3 weeks.  eSigned:  Jerene Bears, MD 01/27/2014 3:43 PM   cc: Carolann Littler, MD and The Patient

## 2014-01-27 NOTE — Patient Instructions (Signed)
YOU HAD AN ENDOSCOPIC PROCEDURE TODAY AT THE [facility]: Refer to the procedure report that was given to you for any specific questions about what was found during the examination.  If the procedure report does not answer your questions, please call your gastroenterologist to clarify.  If you requested that your care partner not be given the details of your procedure findings, then the procedure report has been included in a sealed envelope for you to review at your convenience later.  YOU SHOULD EXPECT: Some feelings of bloating in the abdomen. Passage of more gas than usual.  Walking can help get rid of the air that was put into your GI tract during the procedure and reduce the bloating. If you had a lower endoscopy (such as a colonoscopy or flexible sigmoidoscopy) you may notice spotting of blood in your stool or on the toilet paper. If you underwent a bowel prep for your procedure, then you may not have a normal bowel movement for a few days.  DIET: Your first meal following the procedure should be a light meal and then it is ok to progress to your normal diet.  A half-sandwich or bowl of soup is an example of a good first meal.  Heavy or fried foods are harder to digest and may make you feel nauseous or bloated.  Likewise meals heavy in dairy and vegetables can cause extra gas to form and this can also increase the bloating.  Drink plenty of fluids but you should avoid alcoholic beverages for 24 hours.  ACTIVITY: Your care partner should take you home directly after the procedure.  You should plan to take it easy, moving slowly for the rest of the day.  You can resume normal activity the day after the procedure however you should NOT DRIVE or use heavy machinery for 24 hours (because of the sedation medicines used during the test).    SYMPTOMS TO REPORT IMMEDIATELY: A gastroenterologist can be reached at any hour.  During normal business hours, 8:30 AM to 5:00 PM Monday through Friday,  call [phone].  After hours and on weekends, please call the GI answering service at [phone] who will take a message and have the physician on call contact you.   Following lower endoscopy (colonoscopy or flexible sigmoidoscopy):  Excessive amounts of blood in the stool  Significant tenderness or worsening of abdominal pains  Swelling of the abdomen that is new, acute  Fever of 100F or higher  Following upper endoscopy (EGD)  Vomiting of blood or coffee ground material  New chest pain or pain under the shoulder blades  Painful or persistently difficult swallowing  New shortness of breath  Fever of 100F or higher  Black, tarry-looking stools  FOLLOW UP: If any biopsies were taken you will be contacted by phone or by letter within the next 1-3 weeks.  Call your gastroenterologist if you have not heard about the biopsies in 3 weeks.  Our staff will call the home number listed on your records the next business day following your procedure to check on you and address any questions or concerns that you may have at that time regarding the information given to you following your procedure. This is a courtesy call and so if there is no answer at the home number and we have not heard from you through the emergency physician on call, we will assume that you have returned to your regular daily activities without incident.  SIGNATURES/CONFIDENTIALITY: You and/or your care   partner have signed paperwork which will be entered into your electronic medical record.  These signatures attest to the fact that that the information above on your After Visit Summary has been reviewed and is understood.  Full responsibility of the confidentiality of this discharge information lies with you and/or your care-partner.   Polyp, diverticulosis information given. NO NSAIDS for 2 weeks.  Gastritis and esophagitis information given.  Continue current medications.

## 2014-01-28 ENCOUNTER — Telehealth: Payer: Self-pay | Admitting: *Deleted

## 2014-01-28 NOTE — Telephone Encounter (Signed)
  Follow up Call-  Call back number 01/27/2014  Post procedure Call Back phone  # [phone]  Permission to leave phone message Yes     Patient questions:  Do you have a fever, pain , or abdominal swelling? No. Pain Score  0 *  Have you tolerated food without any problems? Yes.    Have you been able to return to your normal activities? Yes.    Do you have any questions about your discharge instructions: Diet   No. Medications  No. Follow up visit  No.  Do you have questions or concerns about your Care? No.  Actions: * If pain score is 4 or above: No action needed, pain <4.

## 2014-02-03 ENCOUNTER — Encounter: Payer: Self-pay | Admitting: Internal Medicine

## 2014-02-04 ENCOUNTER — Other Ambulatory Visit: Payer: Self-pay

## 2014-02-04 MED ORDER — BIS SUBCIT-METRONID-TETRACYC 140-125-125 MG PO CAPS: 3.0000 | ORAL_CAPSULE | Freq: Three times a day (TID) | ORAL | Status: DC

## 2014-02-04 MED ORDER — OMEPRAZOLE 20 MG PO CPDR: 20.0000 mg | DELAYED_RELEASE_CAPSULE | Freq: Two times a day (BID) | ORAL | Status: DC

## 2014-03-31 ENCOUNTER — Telehealth: Payer: Self-pay

## 2014-03-31 NOTE — Telephone Encounter (Signed)
Pt aware, order in epic. 

## 2014-03-31 NOTE — Telephone Encounter (Signed)
-----   Message from Maury Dus, RN sent at 02/04/2014 12:34 PM EST ----- Regarding: Labs Pt needs stool test for H pylori

## 2014-04-02 ENCOUNTER — Other Ambulatory Visit: Payer: Self-pay

## 2014-04-02 ENCOUNTER — Other Ambulatory Visit: Payer: Self-pay | Admitting: Internal Medicine

## 2014-04-02 DIAGNOSIS — A048 Other specified bacterial intestinal infections: Secondary | ICD-10-CM

## 2014-04-09 ENCOUNTER — Other Ambulatory Visit: Payer: Self-pay

## 2014-04-09 DIAGNOSIS — A048 Other specified bacterial intestinal infections: Secondary | ICD-10-CM

## 2014-04-11 LAB — HELICOBACTER PYLORI  SPECIAL ANTIGEN: H. PYLORI Antigen: NEGATIVE

## 2014-04-13 ENCOUNTER — Other Ambulatory Visit: Payer: Self-pay

## 2014-06-15 ENCOUNTER — Encounter: Payer: Self-pay | Admitting: *Deleted

## 2015-10-05 ENCOUNTER — Other Ambulatory Visit (INDEPENDENT_AMBULATORY_CARE_PROVIDER_SITE_OTHER): Payer: BLUE CROSS/BLUE SHIELD

## 2015-10-05 DIAGNOSIS — Z0000 Encounter for general adult medical examination without abnormal findings: Secondary | ICD-10-CM

## 2015-10-05 LAB — HEPATIC FUNCTION PANEL
ALBUMIN: 4.9 g/dL (ref 3.5–5.2)
ALT: 15 U/L (ref 0–53)
AST: 17 U/L (ref 0–37)
Alkaline Phosphatase: 37 U/L — ABNORMAL LOW (ref 39–117)
BILIRUBIN DIRECT: 0.1 mg/dL (ref 0.0–0.3)
TOTAL PROTEIN: 7 g/dL (ref 6.0–8.3)
Total Bilirubin: 0.7 mg/dL (ref 0.2–1.2)

## 2015-10-05 LAB — CBC WITH DIFFERENTIAL/PLATELET
BASOS PCT: 0.6 % (ref 0.0–3.0)
Basophils Absolute: 0 10*3/uL (ref 0.0–0.1)
EOS ABS: 0.1 10*3/uL (ref 0.0–0.7)
Eosinophils Relative: 1.5 % (ref 0.0–5.0)
HCT: 44.9 % (ref 39.0–52.0)
Hemoglobin: 15.2 g/dL (ref 13.0–17.0)
Lymphocytes Relative: 49.8 % — ABNORMAL HIGH (ref 12.0–46.0)
Lymphs Abs: 2.9 10*3/uL (ref 0.7–4.0)
MCHC: 33.8 g/dL (ref 30.0–36.0)
MCV: 92.8 fl (ref 78.0–100.0)
Monocytes Absolute: 0.5 10*3/uL (ref 0.1–1.0)
Monocytes Relative: 8.2 % (ref 3.0–12.0)
NEUTROS ABS: 2.3 10*3/uL (ref 1.4–7.7)
Neutrophils Relative %: 39.9 % — ABNORMAL LOW (ref 43.0–77.0)
PLATELETS: 275 10*3/uL (ref 150.0–400.0)
RBC: 4.84 Mil/uL (ref 4.22–5.81)
RDW: 13.2 % (ref 11.5–15.5)
WBC: 5.8 10*3/uL (ref 4.0–10.5)

## 2015-10-05 LAB — BASIC METABOLIC PANEL
BUN: 16 mg/dL (ref 6–23)
CO2: 29 meq/L (ref 19–32)
CREATININE: 0.94 mg/dL (ref 0.40–1.50)
Calcium: 10 mg/dL (ref 8.4–10.5)
Chloride: 102 mEq/L (ref 96–112)
GFR: 89.13 mL/min (ref >60.00)
GLUCOSE: 91 mg/dL (ref 70–99)
Potassium: 4.6 mEq/L (ref 3.5–5.1)
Sodium: 138 mEq/L (ref 135–145)

## 2015-10-05 LAB — LIPID PANEL
Cholesterol: 177 mg/dL (ref 0–200)
HDL: 68 mg/dL (ref >39.00)
LDL Cholesterol: 99 mg/dL (ref 0–99)
NonHDL: 109.42
TRIGLYCERIDES: 54 mg/dL (ref 0.0–149.0)
Total CHOL/HDL Ratio: 3
VLDL: 10.8 mg/dL (ref 0.0–40.0)

## 2015-10-05 LAB — TSH: TSH: 2.47 u[IU]/mL (ref 0.35–4.50)

## 2015-10-05 LAB — PSA: PSA: 0.7 ng/mL (ref 0.10–4.00)

## 2015-10-12 ENCOUNTER — Encounter: Payer: Self-pay | Admitting: Family Medicine

## 2015-10-12 ENCOUNTER — Ambulatory Visit (INDEPENDENT_AMBULATORY_CARE_PROVIDER_SITE_OTHER): Payer: BLUE CROSS/BLUE SHIELD | Admitting: Family Medicine

## 2015-10-12 VITALS — BP 110/70 | HR 59 | Temp 98.5°F | Ht 66.0 in | Wt 163.0 lb

## 2015-10-12 DIAGNOSIS — Z0000 Encounter for general adult medical examination without abnormal findings: Secondary | ICD-10-CM | POA: Diagnosis not present

## 2015-10-12 DIAGNOSIS — Z23 Encounter for immunization: Secondary | ICD-10-CM | POA: Diagnosis not present

## 2015-10-12 NOTE — Progress Notes (Signed)
Pre visit review using our clinic review tool, if applicable. No additional management support is needed unless otherwise documented below in the visit note. 

## 2015-10-12 NOTE — Progress Notes (Signed)
Subjective:     Patient ID: Darrell Romero, male   DOB: 15-May-1960, 55 y.o.   MRN: [id number]  HPI Patient here for physical. Generally very healthy. He's had some history of GERD in the past. Few years ago was treated for H. pylori and GERD symptoms have improved. Currently only takes Zantac as needed. 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 Last tetanus over 10 years ago. Nonsmoker. No formal exercise but job is fairly physical. Appetite and weight stable.  Father died of cerebral aneurysm. No other family history of known aneurysm  History of benign colon polyps and will be due for repeat colonoscopy 2020.  No specific risk factors for HIV or hepatitis C  Past Medical History:  Diagnosis Date  . Cataract    RIGHT EYE  . Chicken pox   . GERD (gastroesophageal reflux disease)   . H. pylori infection   . Heart murmur    AS A CHILD  . Hyperlipidemia    Past Surgical History:  Procedure Laterality Date  . KNEE ARTHROSCOPY Left 1998, 1999    reports that he quit smoking about 33 years ago. His smoking use included Cigarettes. He quit after 6.00 years of use. He has never used smokeless tobacco. He reports that he drinks alcohol. He reports that he does not use drugs. family history includes Anuerysm in his father; Arthritis in his father; Heart disease in his paternal aunt; Neuropathy in his father; Stroke (age of onset: 71) in his mother. No Active Allergies    Review of Systems  Constitutional: Negative for activity change, appetite change, fatigue and fever.  HENT: Negative for congestion, ear pain and trouble swallowing.   Eyes: Negative for pain and visual disturbance.  Respiratory: Negative for cough, shortness of breath and wheezing.   Cardiovascular: Negative for chest pain and palpitations.  Gastrointestinal: Negative for abdominal distention, abdominal pain, blood in stool, constipation, diarrhea, nausea, rectal pain and vomiting.  Genitourinary:  Negative for dysuria, hematuria and testicular pain.  Musculoskeletal: Negative for arthralgias and joint swelling.  Skin: Negative for rash.  Neurological: Negative for dizziness, syncope and headaches.  Hematological: Negative for adenopathy.  Psychiatric/Behavioral: Negative for confusion and dysphoric mood.       Objective:   Physical Exam  Constitutional: He is oriented to person, place, and time. He appears well-developed and well-nourished. No distress.  HENT:  Head: Normocephalic and atraumatic.  Right Ear: External ear normal.  Left Ear: External ear normal.  Mouth/Throat: Oropharynx is clear and moist.  Eyes: Conjunctivae and EOM are normal. Pupils are equal, round, and reactive to light.  Neck: Normal range of motion. Neck supple. No thyromegaly present.  Cardiovascular: Normal rate, regular rhythm and normal heart sounds.   No murmur heard. Pulmonary/Chest: No respiratory distress. He has no wheezes. He has no rales.  Abdominal: Soft. Bowel sounds are normal. He exhibits no distension and no mass. There is no tenderness. There is no rebound and no guarding.  Musculoskeletal: He exhibits no edema.  Lymphadenopathy:    He has no cervical adenopathy.  Neurological: He is alert and oriented to person, place, and time. He displays normal reflexes. No cranial nerve deficit.  Skin: No rash noted.  Psychiatric: He has a normal mood and affect.       Assessment:     Physical exam. Generally healthy 55 year old male    Plan:     -labs reviewed with patient. No major concerns. -Tetanus booster given -Recommendation for  yearly flu vaccine -Recommend regular exercise at least 150 minutes per week of moderate activity -repeat colonoscopy 2020  Eulas Post MD [facility]

## 2015-10-12 NOTE — Addendum Note (Signed)
Addended by: Elio Forget on: 10/12/2015 11:44 AM   Modules accepted: Orders

## 2015-10-15 IMAGING — CR DG CHEST 2V
2 series · 2 of 2 positions shown · non-contrast
Comparison: None

CLINICAL DATA: Upper mid chest pain and cough for 1 year, chest
discomfort

EXAM:
CHEST  2 VIEW

[view not recorded (1 of 2)]
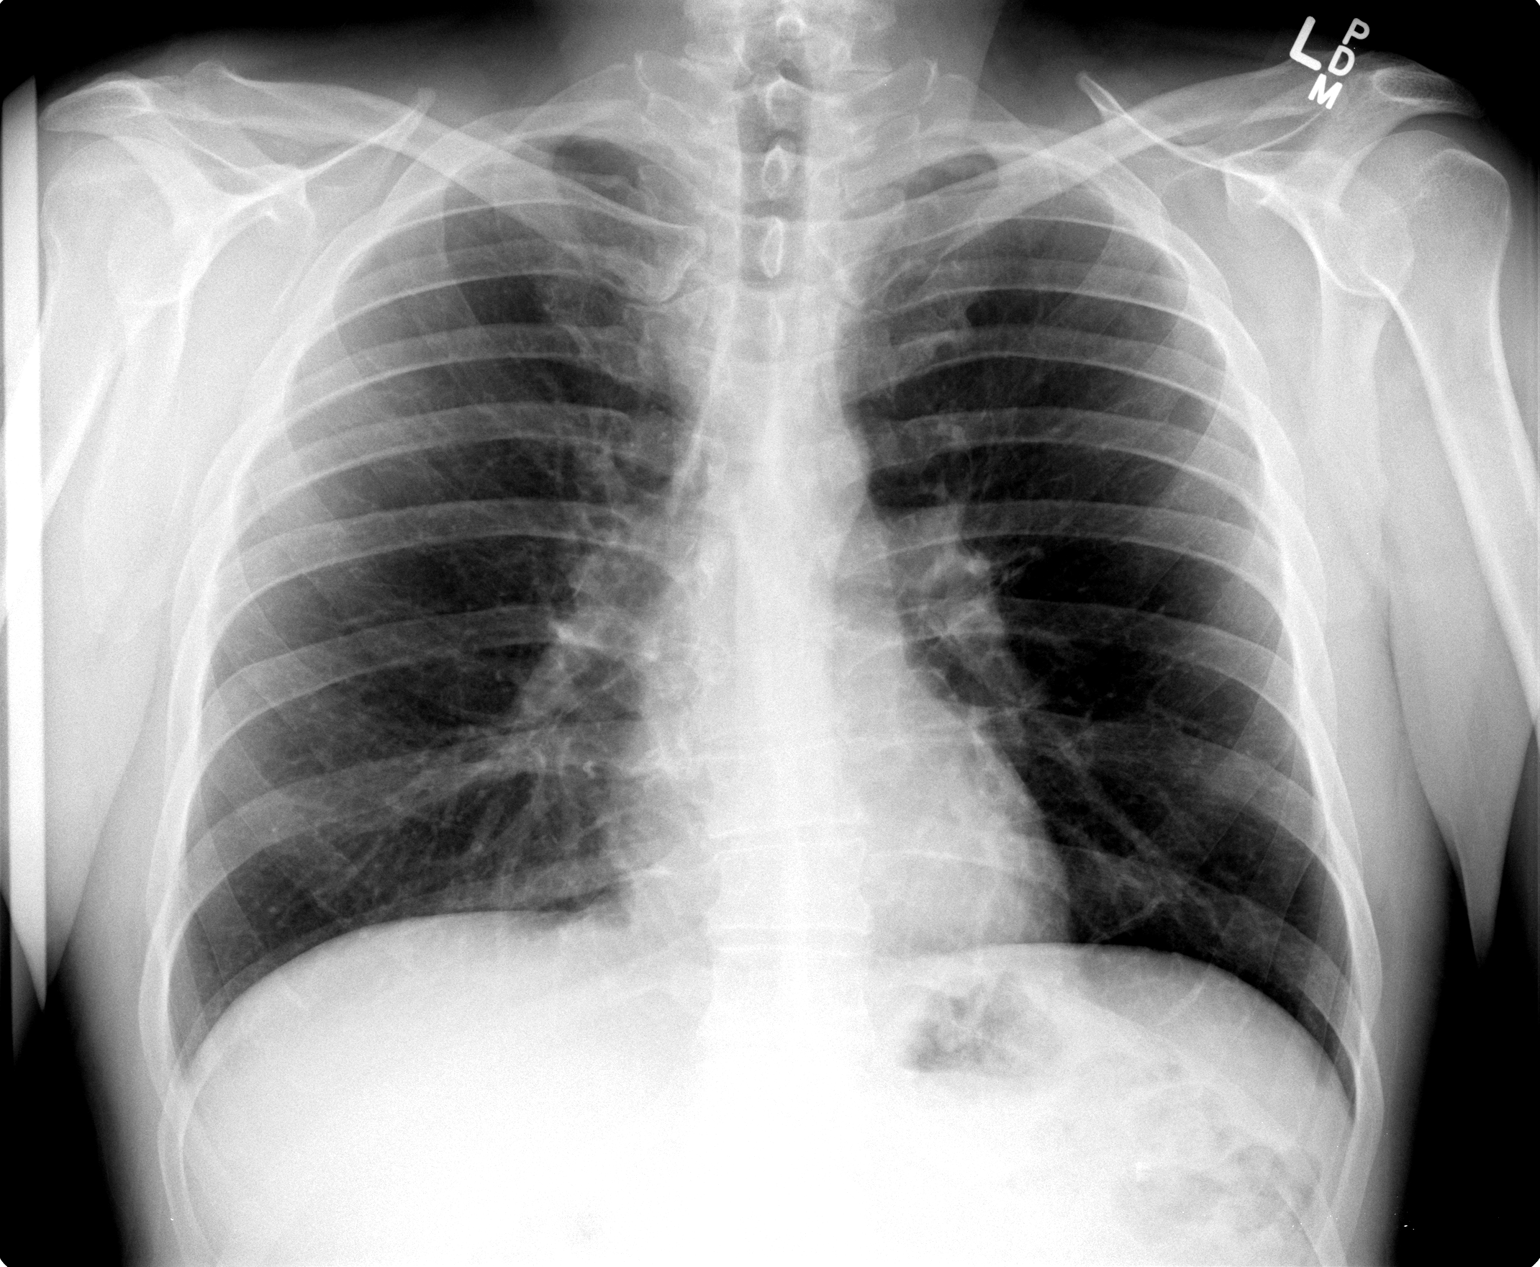

[view not recorded (2 of 2)]
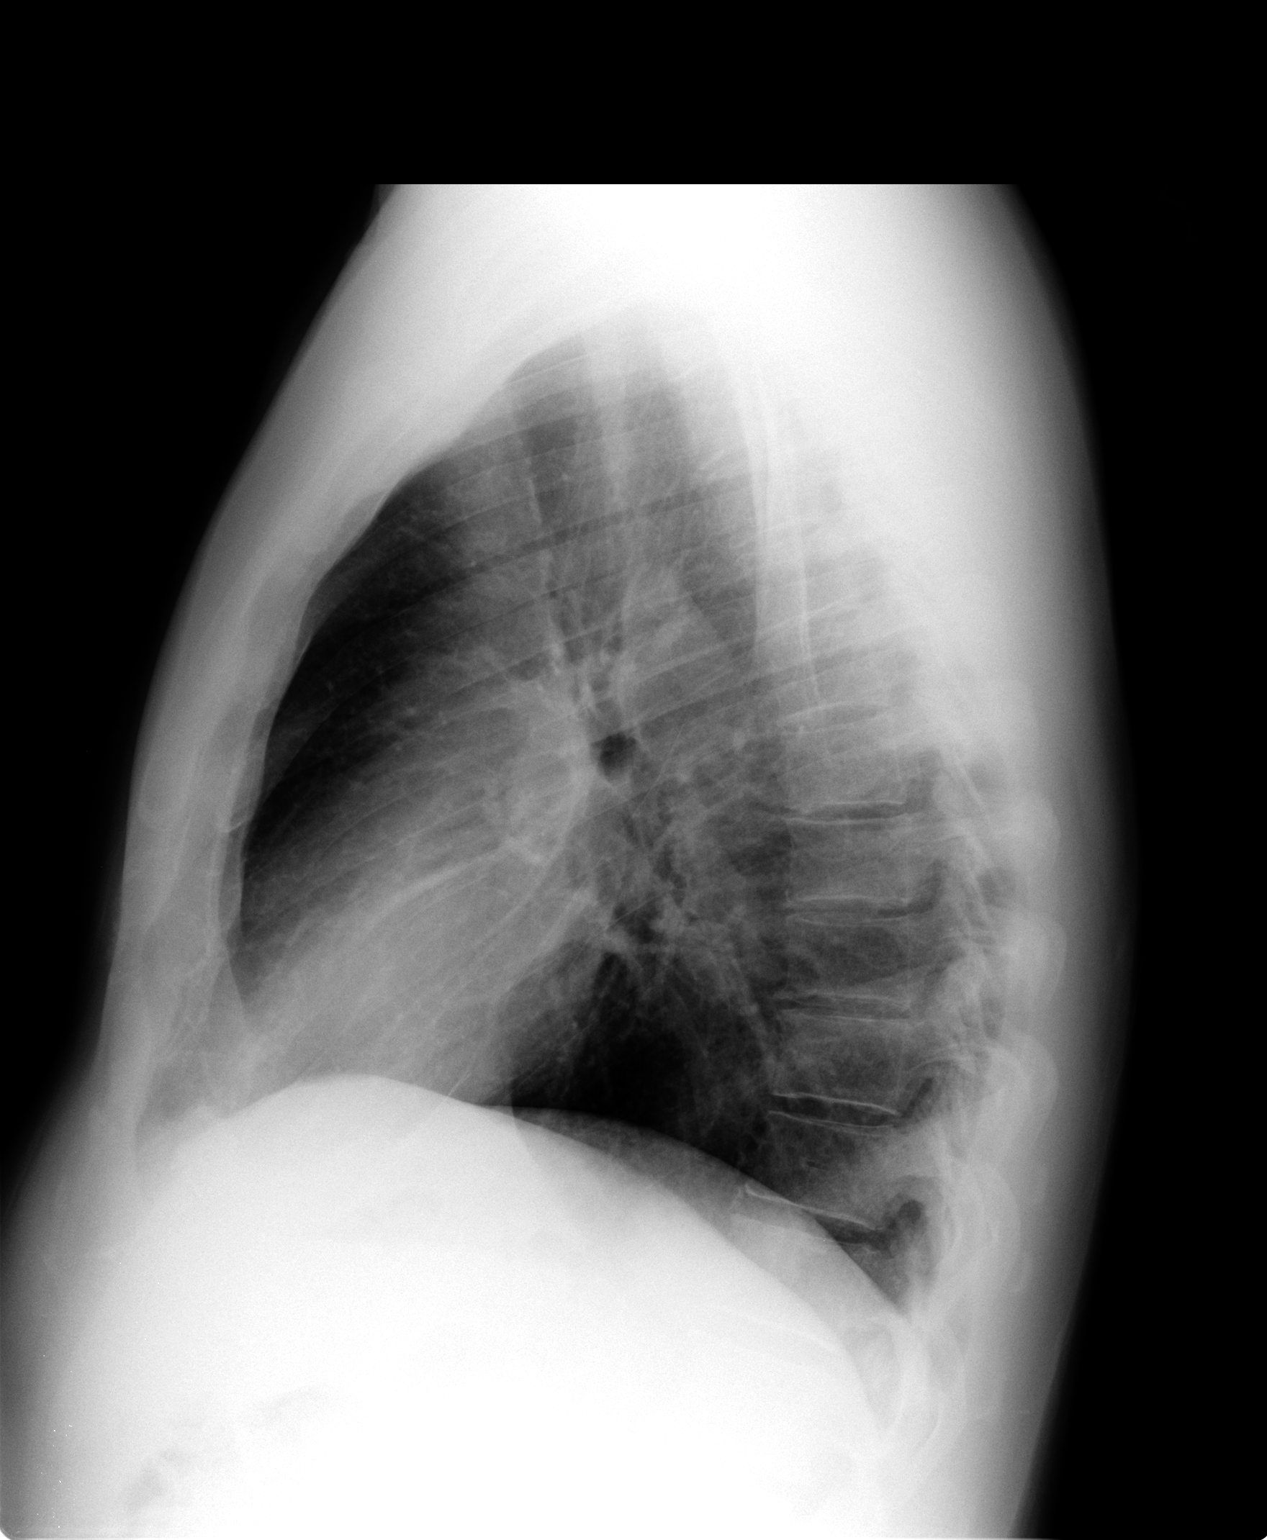

[2 of 2 positions shown; findings below may reference images not displayed]

FINDINGS: Normal heart size, mediastinal contours and pulmonary vascularity.

Lungs clear.

No pleural effusion or pneumothorax.

Bones unremarkable.
IMPRESSION: No acute abnormalities.

## 2016-06-19 ENCOUNTER — Other Ambulatory Visit: Payer: Self-pay | Admitting: Family Medicine

## 2016-06-19 MED ORDER — FAMCICLOVIR 500 MG PO TABS: 500.0000 mg | ORAL_TABLET | ORAL | 0 refills | Status: DC | PRN

## 2016-07-04 ENCOUNTER — Encounter: Payer: BLUE CROSS/BLUE SHIELD | Admitting: Family Medicine

## 2016-11-30 ENCOUNTER — Encounter: Payer: Self-pay | Admitting: Family Medicine

## 2017-04-30 ENCOUNTER — Ambulatory Visit (INDEPENDENT_AMBULATORY_CARE_PROVIDER_SITE_OTHER): Payer: Managed Care, Other (non HMO) | Admitting: Family Medicine

## 2017-04-30 ENCOUNTER — Encounter: Payer: Self-pay | Admitting: Family Medicine

## 2017-04-30 VITALS — BP 120/70 | HR 69 | Temp 98.9°F | Ht 66.75 in | Wt 166.5 lb

## 2017-04-30 DIAGNOSIS — Z0000 Encounter for general adult medical examination without abnormal findings: Secondary | ICD-10-CM | POA: Diagnosis not present

## 2017-04-30 NOTE — Patient Instructions (Signed)
Consider new shingles vaccine (Shingrix).  Check on insurance coverage if interested.

## 2017-04-30 NOTE — Progress Notes (Signed)
Subjective:     Patient ID: Darrell Romero, male   DOB: March 27, 1960, 57 y.o.   MRN: [id number]  HPI Patient seen for physical exam. Nonsmoker who takes no regular medications. He had some chronic dyspepsia if years ago was diagnosed with Helicobacter pylori. He was treated and states his GI symptoms are much improved at this time. He has occasional cold sores takes Famvir for that. Takes occasional Zantac but not regularly. Declines flu vaccine. Tetanus up-to-date. No history of shingles vaccine. Due for repeat colonoscopy 2020  He has family history of cirrhosis in his mother she died around age 6. Question alcoholic cirrhosis. Father apparently died of cerebral aneurysm. 2 sisters who are alive and well  Patient denies regular alcohol use. He quit smoking in his early 67s after just a few years  Past Medical History:  Diagnosis Date  . Cataract    RIGHT EYE  . Chicken pox   . GERD (gastroesophageal reflux disease)   . H. pylori infection   . Heart murmur    AS A CHILD  . Hyperlipidemia    Past Surgical History:  Procedure Laterality Date  . KNEE ARTHROSCOPY Left 1998, 1999    reports that he quit smoking about 35 years ago. His smoking use included cigarettes. He quit after 6.00 years of use. he has never used smokeless tobacco. He reports that he drinks alcohol. He reports that he does not use drugs. family history includes Anuerysm in his father; Arthritis in his father; Heart disease in his paternal aunt; Neuropathy in his father; Stroke (age of onset: 64) in his mother. No Active Allergies    Review of Systems  Constitutional: Negative for activity change, appetite change, fatigue and fever.  HENT: Negative for congestion, ear pain and trouble swallowing.   Eyes: Negative for pain and visual disturbance.  Respiratory: Negative for cough, shortness of breath and wheezing.   Cardiovascular: Negative for chest pain and palpitations.  Gastrointestinal: Negative for abdominal  distention, abdominal pain, blood in stool, constipation, diarrhea, nausea, rectal pain and vomiting.  Genitourinary: Negative for dysuria, hematuria and testicular pain.  Musculoskeletal: Negative for arthralgias and joint swelling.  Skin: Negative for rash.  Neurological: Negative for dizziness, syncope and headaches.  Hematological: Negative for adenopathy.  Psychiatric/Behavioral: Negative for confusion and dysphoric mood.       Objective:   Physical Exam  Constitutional: He is oriented to person, place, and time. He appears well-developed and well-nourished. No distress.  HENT:  Head: Normocephalic and atraumatic.  Right Ear: External ear normal.  Left Ear: External ear normal.  Mouth/Throat: Oropharynx is clear and moist.  Eyes: Conjunctivae and EOM are normal. Pupils are equal, round, and reactive to light.  Neck: Normal range of motion. Neck supple. No thyromegaly present.  Cardiovascular: Normal rate, regular rhythm and normal heart sounds.  No murmur heard. Pulmonary/Chest: No respiratory distress. He has no wheezes. He has no rales.  Abdominal: Soft. Bowel sounds are normal. He exhibits no distension and no mass. There is no tenderness. There is no rebound and no guarding.  Musculoskeletal: He exhibits no edema.  Lymphadenopathy:    He has no cervical adenopathy.  Neurological: He is alert and oriented to person, place, and time. He displays normal reflexes. No cranial nerve deficit.  Skin: No rash noted.  Psychiatric: He has a normal mood and affect.       Assessment:     Physical exam. The following issues were addressed     Plan:     -  Obtain screening lab work. He prefers to come back tomorrow fasting for those  -Discussed new shingles vaccine and he will check on insurance coverage  -Repeat colonoscopy by 2020   Eulas Post MD [facility]

## 2017-05-01 LAB — LIPID PANEL
Cholesterol: 166 mg/dL (ref 0–200)
HDL: 56.4 mg/dL (ref >39.00)
LDL Cholesterol: 97 mg/dL (ref 0–99)
NonHDL: 109.61
TRIGLYCERIDES: 65 mg/dL (ref 0.0–149.0)
Total CHOL/HDL Ratio: 3
VLDL: 13 mg/dL (ref 0.0–40.0)

## 2017-05-01 LAB — CBC WITH DIFFERENTIAL/PLATELET
BASOS ABS: 0.1 10*3/uL (ref 0.0–0.1)
Basophils Relative: 0.9 % (ref 0.0–3.0)
EOS ABS: 0.1 10*3/uL (ref 0.0–0.7)
Eosinophils Relative: 1.7 % (ref 0.0–5.0)
HCT: 43.1 % (ref 39.0–52.0)
Hemoglobin: 14.7 g/dL (ref 13.0–17.0)
LYMPHS ABS: 2.6 10*3/uL (ref 0.7–4.0)
LYMPHS PCT: 46.6 % — AB (ref 12.0–46.0)
MCHC: 34.2 g/dL (ref 30.0–36.0)
MCV: 94.7 fl (ref 78.0–100.0)
Monocytes Absolute: 0.5 10*3/uL (ref 0.1–1.0)
Monocytes Relative: 9.6 % (ref 3.0–12.0)
NEUTROS ABS: 2.3 10*3/uL (ref 1.4–7.7)
NEUTROS PCT: 41.2 % — AB (ref 43.0–77.0)
PLATELETS: 262 10*3/uL (ref 150.0–400.0)
RBC: 4.55 Mil/uL (ref 4.22–5.81)
RDW: 12.8 % (ref 11.5–15.5)
WBC: 5.6 10*3/uL (ref 4.0–10.5)

## 2017-05-01 LAB — BASIC METABOLIC PANEL
BUN: 15 mg/dL (ref 6–23)
CHLORIDE: 103 meq/L (ref 96–112)
CO2: 30 meq/L (ref 19–32)
CREATININE: 0.84 mg/dL (ref 0.40–1.50)
Calcium: 9.9 mg/dL (ref 8.4–10.5)
GFR: 100.89 mL/min (ref >60.00)
Glucose, Bld: 98 mg/dL (ref 70–99)
Potassium: 4.7 mEq/L (ref 3.5–5.1)
Sodium: 139 mEq/L (ref 135–145)

## 2017-05-01 LAB — HEPATIC FUNCTION PANEL
ALBUMIN: 4.6 g/dL (ref 3.5–5.2)
ALK PHOS: 35 U/L — AB (ref 39–117)
ALT: 15 U/L (ref 0–53)
AST: 14 U/L (ref 0–37)
Bilirubin, Direct: 0.1 mg/dL (ref 0.0–0.3)
TOTAL PROTEIN: 6.9 g/dL (ref 6.0–8.3)
Total Bilirubin: 0.7 mg/dL (ref 0.2–1.2)

## 2017-05-01 LAB — PSA: PSA: 0.91 ng/mL (ref 0.10–4.00)

## 2017-05-01 LAB — TSH: TSH: 2.79 u[IU]/mL (ref 0.35–4.50)

## 2017-05-01 NOTE — Addendum Note (Signed)
Addended by: Elmer Picker on: 05/01/2017 07:52 AM   Modules accepted: Orders

## 2017-08-08 ENCOUNTER — Other Ambulatory Visit: Payer: Self-pay | Admitting: Family Medicine

## 2017-08-09 MED ORDER — FAMCICLOVIR 500 MG PO TABS: 500.0000 mg | ORAL_TABLET | ORAL | 2 refills | Status: DC | PRN

## 2019-01-24 ENCOUNTER — Encounter: Payer: Self-pay | Admitting: Internal Medicine

## 2019-08-28 ENCOUNTER — Telehealth: Payer: Self-pay | Admitting: Family Medicine

## 2019-08-28 NOTE — Telephone Encounter (Signed)
famciclovir (FAMVIR) 500 MG tablet  [id number] [place], [place] - [address] AT [place] Phone:  [phone]  Fax:  [phone]

## 2019-08-29 MED ORDER — FAMCICLOVIR 500 MG PO TABS: 500.0000 mg | ORAL_TABLET | ORAL | 0 refills | Status: DC | PRN

## 2019-08-29 NOTE — Telephone Encounter (Signed)
Refill sent in must keep appt for further refills

## 2019-08-29 NOTE — Addendum Note (Signed)
Addended by: Modena Morrow R on: 08/29/2019 07:38 AM   Modules accepted: Orders

## 2019-09-03 ENCOUNTER — Encounter: Payer: Managed Care, Other (non HMO) | Admitting: Family Medicine

## 2020-07-14 ENCOUNTER — Other Ambulatory Visit: Payer: Self-pay

## 2020-07-14 ENCOUNTER — Ambulatory Visit (INDEPENDENT_AMBULATORY_CARE_PROVIDER_SITE_OTHER): Payer: Managed Care, Other (non HMO) | Admitting: Family Medicine

## 2020-07-14 ENCOUNTER — Encounter: Payer: Self-pay | Admitting: Family Medicine

## 2020-07-14 VITALS — BP 110/64 | HR 51 | Temp 98.1°F | Ht 67.0 in | Wt 164.4 lb

## 2020-07-14 DIAGNOSIS — K219 Gastro-esophageal reflux disease without esophagitis: Secondary | ICD-10-CM | POA: Diagnosis not present

## 2020-07-14 DIAGNOSIS — Z86010 Personal history of colonic polyps: Secondary | ICD-10-CM

## 2020-07-14 DIAGNOSIS — Z0000 Encounter for general adult medical examination without abnormal findings: Secondary | ICD-10-CM

## 2020-07-14 LAB — CBC WITH DIFFERENTIAL/PLATELET
Basophils Absolute: 0 10*3/uL (ref 0.0–0.1)
Basophils Relative: 0.8 % (ref 0.0–3.0)
Eosinophils Absolute: 0.1 10*3/uL (ref 0.0–0.7)
Eosinophils Relative: 1.3 % (ref 0.0–5.0)
HCT: 42.2 % (ref 39.0–52.0)
Hemoglobin: 14.7 g/dL (ref 13.0–17.0)
Lymphocytes Relative: 45 % (ref 12.0–46.0)
Lymphs Abs: 2.2 10*3/uL (ref 0.7–4.0)
MCHC: 34.9 g/dL (ref 30.0–36.0)
MCV: 94.4 fl (ref 78.0–100.0)
Monocytes Absolute: 0.4 10*3/uL (ref 0.1–1.0)
Monocytes Relative: 8.3 % (ref 3.0–12.0)
Neutro Abs: 2.2 10*3/uL (ref 1.4–7.7)
Neutrophils Relative %: 44.6 % (ref 43.0–77.0)
Platelets: 275 10*3/uL (ref 150.0–400.0)
RBC: 4.47 Mil/uL (ref 4.22–5.81)
RDW: 12.8 % (ref 11.5–15.5)
WBC: 5 10*3/uL (ref 4.0–10.5)

## 2020-07-14 LAB — HEPATIC FUNCTION PANEL
ALT: 16 U/L (ref 0–53)
AST: 18 U/L (ref 0–37)
Albumin: 4.6 g/dL (ref 3.5–5.2)
Alkaline Phosphatase: 44 U/L (ref 39–117)
Bilirubin, Direct: 0.1 mg/dL (ref 0.0–0.3)
Total Bilirubin: 0.7 mg/dL (ref 0.2–1.2)
Total Protein: 6.7 g/dL (ref 6.0–8.3)

## 2020-07-14 LAB — LIPID PANEL
Cholesterol: 171 mg/dL (ref 0–200)
HDL: 67.3 mg/dL (ref >39.00)
LDL Cholesterol: 89 mg/dL (ref 0–99)
NonHDL: 104.06
Total CHOL/HDL Ratio: 3
Triglycerides: 77 mg/dL (ref 0.0–149.0)
VLDL: 15.4 mg/dL (ref 0.0–40.0)

## 2020-07-14 LAB — BASIC METABOLIC PANEL
BUN: 9 mg/dL (ref 6–23)
CO2: 29 mEq/L (ref 19–32)
Calcium: 9.8 mg/dL (ref 8.4–10.5)
Chloride: 103 mEq/L (ref 96–112)
Creatinine, Ser: 0.87 mg/dL (ref 0.40–1.50)
GFR: 95.41 mL/min (ref >60.00)
Glucose, Bld: 103 mg/dL — ABNORMAL HIGH (ref 70–99)
Potassium: 4.6 mEq/L (ref 3.5–5.1)
Sodium: 139 mEq/L (ref 135–145)

## 2020-07-14 LAB — PSA: PSA: 0.89 ng/mL (ref 0.10–4.00)

## 2020-07-14 LAB — TSH: TSH: 3.23 u[IU]/mL (ref 0.35–4.50)

## 2020-07-14 NOTE — Progress Notes (Signed)
Established Patient Office Visit  Subjective:  Patient ID: Darrell Romero, male    DOB: 09-14-1960  Age: 60 y.o. MRN: [id number]  CC:  Chief Complaint  Patient presents with  . Annual Exam    HPI Darrell Romero presents for CPE.  Generally fairly healthy.  He takes no regular medications.  He has had history of dyspepsia in the past and had positive helical back to pylori testing years ago.  This was treated.  He has tapered off H2 blockers and has had no breakthrough symptoms.  He takes no regular medications and no over-the-counter medications currently.  Health maintenance reviewed  -Tetanus due 2027 -Apparently due for repeat colonoscopy. -He had COVID vaccines but no booster -No history of hepatitis C screening -No history of shingles vaccine  Family history-somewhat vague.  His mom died when she was age 72 reportedly of some type of seizure complication and possible stroke.  He is not sure origin of stroke.  He was basically placed on [facility] at age 51.  His father history is somewhat sketchy but apparently he died age 59 of a cerebral aneurysm.  He does not have clear knowledge and his mom had a cerebral aneurysm.  No other known first-degree relatives with cerebral aneurysm.  Social history-married with 4 children and 15 grandchildren.  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  Drinks 1-2 beers per day but never more.  Quit smoking 1984 after just a couple year history  Past Medical History:  Diagnosis Date  . Cataract    RIGHT EYE  . Chicken pox   . GERD (gastroesophageal reflux disease)   . H. pylori infection   . Heart murmur    AS A CHILD  . Hyperlipidemia     Past Surgical History:  Procedure Laterality Date  . KNEE ARTHROSCOPY Left 1998, 1999    Family History  Problem Relation Age of Onset  . Arthritis Father   . Neuropathy Father   . Anuerysm Father   . Stroke Mother 76       died of stroke complications  . Heart disease  Paternal Aunt     Social History   Socioeconomic History  . Marital status: Married    Spouse name: Not on file  . Number of children: 4  . Years of education: Not on file  . Highest education level: Not on file  Occupational History  . Occupation: Sport and exercise psychologist: [employer]    Comment: [employer]  Tobacco Use  . Smoking status: Former Smoker    Years: 6.00    Types: Cigarettes    Quit date: 03/13/1982    Years since quitting: 38.3  . Smokeless tobacco: Never Used  Substance and Sexual Activity  . Alcohol use: Yes    Alcohol/week: 0.0 standard drinks    Comment: 2-3 on the weekends only  . Drug use: No  . Sexual activity: Not on file  Other Topics Concern  . Not on file  Social History Narrative  . Not on file   Social Determinants of Health   Financial Resource Strain: Not on file  Food Insecurity: Not on file  Transportation Needs: Not on file  Physical Activity: Not on file  Stress: Not on file  Social Connections: Not on file  Intimate Partner Violence: Not on file    Outpatient Medications Prior to Visit  Medication Sig Dispense Refill  . famciclovir (FAMVIR) 500 MG tablet Take 1  tablet (500 mg total) by mouth as needed. 20 tablet 0  . ranitidine (ZANTAC) 150 MG capsule Take 150 mg by mouth 2 (two) times daily.     No facility-administered medications prior to visit.    No Active Allergies  ROS Review of Systems  Constitutional: Negative for activity change, appetite change, fatigue and fever.  HENT: Negative for congestion, ear pain and trouble swallowing.   Eyes: Negative for pain and visual disturbance.  Respiratory: Negative for cough, shortness of breath and wheezing.   Cardiovascular: Negative for chest pain and palpitations.  Gastrointestinal: Negative for abdominal distention, abdominal pain, blood in stool, constipation, diarrhea, nausea, rectal pain and vomiting.  Genitourinary: Negative for dysuria, hematuria and  testicular pain.  Musculoskeletal: Negative for arthralgias and joint swelling.  Skin: Negative for rash.  Neurological: Negative for dizziness, syncope and headaches.  Hematological: Negative for adenopathy.  Psychiatric/Behavioral: Negative for confusion and dysphoric mood.      Objective:    Physical Exam Constitutional:      General: He is not in acute distress.    Appearance: He is well-developed.  HENT:     Head: Normocephalic and atraumatic.     Right Ear: External ear normal.     Left Ear: External ear normal.  Eyes:     Conjunctiva/sclera: Conjunctivae normal.     Pupils: Pupils are equal, round, and reactive to light.  Neck:     Thyroid: No thyromegaly.  Cardiovascular:     Rate and Rhythm: Normal rate and regular rhythm.     Heart sounds: Normal heart sounds. No murmur heard.   Pulmonary:     Effort: No respiratory distress.     Breath sounds: No wheezing or rales.  Abdominal:     General: Bowel sounds are normal. There is no distension.     Palpations: Abdomen is soft. There is no mass.     Tenderness: There is no abdominal tenderness. There is no guarding or rebound.  Musculoskeletal:     Cervical back: Normal range of motion and neck supple.  Lymphadenopathy:     Cervical: No cervical adenopathy.  Skin:    Findings: No rash.  Neurological:     Mental Status: He is alert and oriented to person, place, and time.     Cranial Nerves: No cranial nerve deficit.     Deep Tendon Reflexes: Reflexes normal.     BP 110/64 (BP Location: Left Arm, Patient Position: Sitting, Cuff Size: Normal)   Pulse (!) 51   Temp 98.1 F (36.7 C) (Oral)   Ht 5\' 7"  (1.702 m)   Wt 164 lb 6.4 oz (74.6 kg)   SpO2 97%   BMI 25.75 kg/m  Wt Readings from Last 3 Encounters:  07/14/20 164 lb 6.4 oz (74.6 kg)  04/30/17 166 lb 8 oz (75.5 kg)  10/12/15 163 lb (73.9 kg)     Health Maintenance Due  Topic Date Due  . Hepatitis C Screening  Never done  . HIV Screening  Never done     There are no preventive care reminders to display for this patient.  Lab Results  Component Value Date   TSH 2.79 05/01/2017   Lab Results  Component Value Date   WBC 5.6 05/01/2017   HGB 14.7 05/01/2017   HCT 43.1 05/01/2017   MCV 94.7 05/01/2017   PLT 262.0 05/01/2017   Lab Results  Component Value Date   NA 139 05/01/2017   K 4.7 05/01/2017   CO2 30  05/01/2017   GLUCOSE 98 05/01/2017   BUN 15 05/01/2017   CREATININE 0.84 05/01/2017   BILITOT 0.7 05/01/2017   ALKPHOS 35 (L) 05/01/2017   AST 14 05/01/2017   ALT 15 05/01/2017   PROT 6.9 05/01/2017   ALBUMIN 4.6 05/01/2017   CALCIUM 9.9 05/01/2017   GFR 100.89 05/01/2017   Lab Results  Component Value Date   CHOL 166 05/01/2017   Lab Results  Component Value Date   HDL 56.40 05/01/2017   Lab Results  Component Value Date   LDLCALC 97 05/01/2017   Lab Results  Component Value Date   TRIG 65.0 05/01/2017   Lab Results  Component Value Date   CHOLHDL 3 05/01/2017   No results found for: HGBA1C    Assessment & Plan:   Problem List Items Addressed This Visit      Unprioritized   GERD - Primary   Relevant Orders   Ambulatory referral to Gastroenterology    Other Visit Diagnoses    Hx of colonic polyps       Relevant Orders   Ambulatory referral to Gastroenterology   Physical exam       Relevant Orders   Basic metabolic panel   Lipid panel   CBC with Differential/Platelet   TSH   Hepatic function panel   PSA   Hep C Antibody    Recommended the following health maintenance items  -Recommend shingles vaccine and he will check on coverage -Check screening labs including hepatitis C antibody and PSA -Set up GI referral -Recommend annual flu vaccine  No orders of the defined types were placed in this encounter.   Follow-up: No follow-ups on file.    Carolann Littler, MD

## 2020-07-14 NOTE — Patient Instructions (Signed)

## 2020-07-15 LAB — HEPATITIS C ANTIBODY
Hepatitis C Ab: NONREACTIVE
SIGNAL TO CUT-OFF: 0.01 (ref <1.00)

## 2020-08-25 ENCOUNTER — Encounter: Payer: Self-pay | Admitting: Family Medicine

## 2020-11-03 ENCOUNTER — Encounter: Payer: Self-pay | Admitting: Internal Medicine

## 2020-12-31 ENCOUNTER — Encounter: Payer: Self-pay | Admitting: Internal Medicine

## 2020-12-31 ENCOUNTER — Other Ambulatory Visit: Payer: Self-pay

## 2020-12-31 ENCOUNTER — Ambulatory Visit (AMBULATORY_SURGERY_CENTER): Payer: Managed Care, Other (non HMO)

## 2020-12-31 VITALS — Ht 66.5 in | Wt 155.0 lb

## 2020-12-31 DIAGNOSIS — Z86010 Personal history of colonic polyps: Secondary | ICD-10-CM

## 2020-12-31 MED ORDER — PEG-KCL-NACL-NASULF-NA ASC-C 100 G PO SOLR
1.0000 | Freq: Once | ORAL | 0 refills | Status: AC
Start: 2020-12-31 — End: 2020-12-31

## 2021-01-14 ENCOUNTER — Encounter: Payer: Self-pay | Admitting: Internal Medicine

## 2021-01-14 ENCOUNTER — Ambulatory Visit (AMBULATORY_SURGERY_CENTER): Payer: Managed Care, Other (non HMO) | Admitting: Internal Medicine

## 2021-01-14 ENCOUNTER — Other Ambulatory Visit: Payer: Self-pay

## 2021-01-14 VITALS — BP 129/71 | HR 52 | Temp 97.5°F | Resp 11 | Ht 66.5 in | Wt 155.0 lb

## 2021-01-14 DIAGNOSIS — Z86010 Personal history of colonic polyps: Secondary | ICD-10-CM | POA: Diagnosis present

## 2021-01-14 DIAGNOSIS — D128 Benign neoplasm of rectum: Secondary | ICD-10-CM

## 2021-01-14 MED ORDER — SODIUM CHLORIDE 0.9 % IV SOLN: 500.0000 mL | Freq: Once | INTRAVENOUS | Status: DC

## 2021-01-14 NOTE — Progress Notes (Signed)
GASTROENTEROLOGY PROCEDURE H&P NOTE   Primary Care Physician: Eulas Post, MD    Reason for Procedure:  History of adenomatous colon polyps  Plan:    Colonoscopy  Patient is appropriate for endoscopic procedure(s) in the ambulatory ([facility]) setting.  The nature of the procedure, as well as the risks, benefits, and alternatives were carefully and thoroughly reviewed with the patient. Ample time for discussion and questions allowed. The patient understood, was satisfied, and agreed to proceed.     HPI: Darrell Romero is a 60 y.o. male who presents for surveillance colonoscopy.  Medical history as below.  Tolerated the prep.  No recent chest pain or shortness of breath.  Past Medical History:  Diagnosis Date   Cataract    RIGHT EYE and LEFT EYE   Chicken pox    GERD (gastroesophageal reflux disease)    H. pylori infection    Heart murmur    AS A CHILD   Hyperlipidemia    diet controlled , no meds    Past Surgical History:  Procedure Laterality Date   COLONOSCOPY     KNEE ARTHROSCOPY Left 1998, 1999   POLYPECTOMY      Prior to Admission medications   Medication Sig Start Date End Date Taking? Authorizing Provider  Multiple Vitamins-Minerals (EMERGEN-C IMMUNE PO) Take by mouth.   Yes [provider]  famciclovir (FAMVIR) 500 MG tablet Take 1 tablet (500 mg total) by mouth as needed. 08/29/19   Burchette, Alinda Sierras, MD    Current Outpatient Medications  Medication Sig Dispense Refill   Multiple Vitamins-Minerals (EMERGEN-C IMMUNE PO) Take by mouth.     famciclovir (FAMVIR) 500 MG tablet Take 1 tablet (500 mg total) by mouth as needed. 20 tablet 0   Current Facility-Administered Medications  Medication Dose Route Frequency Provider Last Rate Last Admin   0.9 %  sodium chloride infusion  500 mL Intravenous Once Aveleen Nevers, Lajuan Lines, MD        Allergies as of 01/14/2021   (No Known Allergies)    Family History  Problem Relation Age of Onset   Stroke  Mother 3       died of stroke complications   Arthritis Father    Neuropathy Father    Anuerysm Father    Heart disease Paternal Aunt    Colon cancer Neg Hx    Colon polyps Neg Hx    Esophageal cancer Neg Hx    Rectal cancer Neg Hx    Stomach cancer Neg Hx     Social History   Socioeconomic History   Marital status: Married    Spouse name: Not on file   Number of children: 4   Years of education: Not on file   Highest education level: Not on file  Occupational History   Occupation: Tax adviser    Employer: [employer]    Comment: [employer]  Tobacco Use   Smoking status: Former    Years: 6.00    Types: Cigarettes    Quit date: 03/13/1982    Years since quitting: 38.8   Smokeless tobacco: Never  Vaping Use   Vaping Use: Never used  Substance and Sexual Activity   Alcohol use: Yes    Alcohol/week: 0.0 standard drinks    Comment: 2-3 on the weekends only   Drug use: No   Sexual activity: Yes  Other Topics Concern   Not on file  Social History Narrative   Not on file   Social Determinants  of Health   Financial Resource Strain: Not on file  Food Insecurity: Not on file  Transportation Needs: Not on file  Physical Activity: Not on file  Stress: Not on file  Social Connections: Not on file  Intimate Partner Violence: Not on file    Physical Exam: Vital signs in last 24 hours: @BP  124/66   Pulse (!) 50   Temp (!) 97.5 F (36.4 C) (Temporal)   Ht 5' 6.5" (1.689 m)   Wt 155 lb (70.3 kg)   SpO2 100%   BMI 24.64 kg/m  GEN: NAD EYE: Sclerae anicteric ENT: MMM CV: Non-tachycardic Pulm: CTA b/l GI: Soft, NT/ND NEURO:  Alert & Oriented x 3   Darrell Jarred, MD [facility]  01/14/2021 11:47 AM

## 2021-01-14 NOTE — Progress Notes (Signed)
To PACU< VSS. Report to [email or web address] 

## 2021-01-14 NOTE — Patient Instructions (Signed)
Read all of the handouts given to you by your recovery room nurse.  YOU HAD AN ENDOSCOPIC PROCEDURE TODAY AT THE [facility]:   Refer to the procedure report that was given to you for any specific questions about what was found during the examination.  If the procedure report does not answer your questions, please call your gastroenterologist to clarify.  If you requested that your care partner not be given the details of your procedure findings, then the procedure report has been included in a sealed envelope for you to review at your convenience later.  YOU SHOULD EXPECT: Some feelings of bloating in the abdomen. Passage of more gas than usual.  Walking can help get rid of the air that was put into your GI tract during the procedure and reduce the bloating. If you had a lower endoscopy (such as a colonoscopy or flexible sigmoidoscopy) you may notice spotting of blood in your stool or on the toilet paper. If you underwent a bowel prep for your procedure, you may not have a normal bowel movement for a few days.  Please Note:  You might notice some irritation and congestion in your nose or some drainage.  This is from the oxygen used during your procedure.  There is no need for concern and it should clear up in a day or so.  SYMPTOMS TO REPORT IMMEDIATELY:  Following lower endoscopy (colonoscopy or flexible sigmoidoscopy):  Excessive amounts of blood in the stool  Significant tenderness or worsening of abdominal pains  Swelling of the abdomen that is new, acute  Fever of 100F or higher   For urgent or emergent issues, a gastroenterologist can be reached at any hour by calling [phone]. Do not use MyChart messaging for urgent concerns.    DIET:  We do recommend a small meal at first, but then you may proceed to your regular diet.  Drink plenty of fluids but you should avoid alcoholic beverages for 24 hours. Try to increase the fiber in your diet, and drink plenty of  water.  ACTIVITY:  You should plan to take it easy for the rest of today and you should NOT DRIVE or use heavy machinery until tomorrow (because of the sedation medicines used during the test).    FOLLOW UP: Our staff will call the number listed on your records 48-72 hours following your procedure to check on you and address any questions or concerns that you may have regarding the information given to you following your procedure. If we do not reach you, we will leave a message.  We will attempt to reach you two times.  During this call, we will ask if you have developed any symptoms of COVID 19. If you develop any symptoms (ie: fever, flu-like symptoms, shortness of breath, cough etc.) before then, please call [phone].  If you test positive for Covid 19 in the 2 weeks post procedure, please call and report this information to us.    If any biopsies were taken you will be contacted by phone or by letter within the next 1-3 weeks.  Please call us at [phone] if you have not heard about the biopsies in 3 weeks.    SIGNATURES/CONFIDENTIALITY: You and/or your care partner have signed paperwork which will be entered into your electronic medical record.  These signatures attest to the fact that that the information above on your After Visit Summary has been reviewed and is understood.  Full responsibility of the confidentiality of this   discharge information lies with you and/or your care-partner.  

## 2021-01-14 NOTE — Progress Notes (Signed)
Pt's states no medical or surgical changes since previsit or office visit.  Vitals DT 

## 2021-01-14 NOTE — Progress Notes (Signed)
Called to room to assist during endoscopic procedure.  Patient ID and intended procedure confirmed with present staff. Received instructions for my participation in the procedure from the performing physician.  

## 2021-01-14 NOTE — Op Note (Signed)
[name]: Darrell Romero Procedure Date: 01/14/2021 11:47 AM MRN: [id number] Endoscopist: Jerene Bears , MD Age: 60 Referring MD:  Date of Birth: [birth year] Gender: Male Account #: [id number] Procedure:                Colonoscopy Indications:              High risk colon cancer surveillance: Personal                            history of non-advanced adenomas, Last colonoscopy:                            November 2015 Medicines:                Monitored Anesthesia Care Procedure:                Pre-Anesthesia Assessment:                           - Prior to the procedure, a History and Physical                            was performed, and patient medications and                            allergies were reviewed. The patient's tolerance of                            previous anesthesia was also reviewed. The risks                            and benefits of the procedure and the sedation                            options and risks were discussed with the patient.                            All questions were answered, and informed consent                            was obtained. Prior Anticoagulants: The patient has                            taken no previous anticoagulant or antiplatelet                            agents. ASA Grade Assessment: II - A patient with                            mild systemic disease. After reviewing the risks                            and benefits, the patient was deemed in  satisfactory condition to undergo the procedure.                           After obtaining informed consent, the colonoscope                            was passed under direct vision. Throughout the                            procedure, the patient's blood pressure, pulse, and                            oxygen saturations were monitored continuously. The                            Olympus PCF-H190DL (#[id number]) Colonoscope was                             introduced through the anus and advanced to the                            cecum, identified by appendiceal orifice and                            ileocecal valve. The colonoscopy was performed                            without difficulty. The patient tolerated the                            procedure well. The quality of the bowel                            preparation was good. The ileocecal valve,                            appendiceal orifice, and rectum were photographed. Scope In: 12:01:30 PM Scope Out: 12:15:56 PM Scope Withdrawal Time: 0 hours 12 minutes 42 seconds  Total Procedure Duration: 0 hours 14 minutes 26 seconds  Findings:                 The digital rectal exam was normal.                           A 6 mm polyp was found in the proximal rectum. The                            polyp was sessile. The polyp was removed with a                            cold snare. Resection and retrieval were complete.                           Multiple small-mouthed diverticula were found in  the sigmoid colon.                           The exam was otherwise without abnormality on                            direct and retroflexion views. Complications:            No immediate complications. Estimated Blood Loss:     Estimated blood loss was minimal. Impression:               - One 6 mm polyp in the proximal rectum, removed                            with a cold snare. Resected and retrieved.                           - Diverticulosis in the sigmoid colon.                           - The examination was otherwise normal on direct                            and retroflexion views. Recommendation:           - Patient has a contact number available for                            emergencies. The signs and symptoms of potential                            delayed complications were discussed with the                            patient. Return to  normal activities tomorrow.                            Written discharge instructions were provided to the                            patient.                           - Resume previous diet.                           - Continue present medications.                           - Await pathology results.                           - Repeat colonoscopy is recommended for                            surveillance. The colonoscopy date will be  determined after pathology results from today's                            exam become available for review. Jerene Bears, MD 01/14/2021 12:19:42 PM This report has been signed electronically.

## 2021-01-18 ENCOUNTER — Telehealth: Payer: Self-pay

## 2021-01-18 ENCOUNTER — Encounter: Payer: Self-pay | Admitting: Internal Medicine

## 2021-01-18 ENCOUNTER — Telehealth: Payer: Self-pay | Admitting: *Deleted

## 2021-01-18 NOTE — Telephone Encounter (Signed)
Attempted f/u phone call. No answer. Left message. °

## 2021-01-18 NOTE — Telephone Encounter (Signed)
Attempted to reach pt. With follow-up call following endoscopic procedure 01/14/2021.  LM on pt. Voice mail to call if he has any questions or concerns.

## 2021-06-10 ENCOUNTER — Telehealth: Payer: Self-pay

## 2021-06-10 NOTE — Telephone Encounter (Signed)
Last cpe 07/14/20. ?LVM instructions for pt to call back to schedule cpe 07/15/21 or after. ?

## 2022-05-03 ENCOUNTER — Ambulatory Visit: Payer: Managed Care, Other (non HMO) | Admitting: Family Medicine

## 2022-05-03 VITALS — BP 124/60 | HR 85 | Temp 98.7°F | Ht 66.5 in | Wt 165.6 lb

## 2022-05-03 DIAGNOSIS — N401 Enlarged prostate with lower urinary tract symptoms: Secondary | ICD-10-CM | POA: Diagnosis not present

## 2022-05-03 DIAGNOSIS — R3912 Poor urinary stream: Secondary | ICD-10-CM

## 2022-05-03 NOTE — Patient Instructions (Signed)
Set up physical and we will plan to check PSA at that time     Consider daily fiber supplement to see if that reduces fecal leakage.

## 2022-05-03 NOTE — Progress Notes (Signed)
   Established Patient Office Visit  Subjective   Patient ID: [name], male    DOB: Feb 03, 1961  Age: 62 y.o. MRN: [id number]  Chief Complaint  Patient presents with   Prostate Check    HPI   Darrell Romero is seen with concerns for possible prostate enlargement issue.  He has noticed some slow stream over the past year.  No nocturia.  No burning with urination.  Last physical was a couple years ago and PSA was normal at that time.  Occasional mild fecal leakage but not consistently.  Colonoscopy 2022 which is unremarkable.  No regular medications.  No anticholinergics.  Past Medical History:  Diagnosis Date   Cataract    RIGHT EYE and LEFT EYE   Chicken pox    GERD (gastroesophageal reflux disease)    H. pylori infection    Heart murmur    AS A CHILD   Hyperlipidemia    diet controlled , no meds   Past Surgical History:  Procedure Laterality Date   COLONOSCOPY     KNEE ARTHROSCOPY Left 1998, 1999   POLYPECTOMY      reports that he quit smoking about 40 years ago. His smoking use included cigarettes. He has never used smokeless tobacco. He reports current alcohol use. He reports that he does not use drugs. family history includes Anuerysm in his father; Arthritis in his father; Heart disease in his paternal aunt; Neuropathy in his father; Stroke (age of onset: 65) in his mother. No Known Allergies  Review of Systems  Constitutional:  Negative for chills and fever.  Genitourinary:  Negative for hematuria and urgency.      Objective:     BP 124/60 (BP Location: Left Arm, Patient Position: Sitting, Cuff Size: Normal)   Pulse 85   Temp 98.7 F (37.1 C) (Oral)   Ht 5' 6.5" (1.689 m)   Wt 165 lb 9.6 oz (75.1 kg)   SpO2 98%   BMI 26.33 kg/m  BP Readings from Last 3 Encounters:  05/03/22 124/60  01/14/21 129/71  07/14/20 110/64   Wt Readings from Last 3 Encounters:  05/03/22 165 lb 9.6 oz (75.1 kg)  01/14/21 155 lb (70.3 kg)  12/31/20 155 lb (70.3 kg)       Physical Exam Vitals reviewed.  Constitutional:      Appearance: Normal appearance.  Genitourinary:    Comments: Normal anal sphincter tone.  Digital exam reveals slightly enlarged prostate but no nodules.  No asymmetry.  No rectal masses. Neurological:     Mental Status: He is alert.      No results found for any visits on 05/03/22.    The 10-year ASCVD risk score (Arnett DK, et al., 2019) is: 5.2%    Assessment & Plan:   Intermittent slow stream.  Suspect related to BPH.  No evidence for acute prostatitis.  We discussed avoidance of anticholinergic medications.  Discussed options such as Flomax but at this point he does not feel symptoms are severe enough to warrant.  We have recommended setting up complete physical and plan to get labs at that point including PSA.   Carolann Littler, MD

## 2022-08-16 ENCOUNTER — Ambulatory Visit (INDEPENDENT_AMBULATORY_CARE_PROVIDER_SITE_OTHER): Payer: Managed Care, Other (non HMO) | Admitting: Family Medicine

## 2022-08-16 ENCOUNTER — Encounter: Payer: Self-pay | Admitting: Family Medicine

## 2022-08-16 VITALS — BP 110/60 | HR 71 | Temp 99.0°F | Ht 66.54 in | Wt 163.8 lb

## 2022-08-16 DIAGNOSIS — Z0000 Encounter for general adult medical examination without abnormal findings: Secondary | ICD-10-CM

## 2022-08-16 LAB — LIPID PANEL
Cholesterol: 185 mg/dL (ref 0–200)
HDL: 67.1 mg/dL (ref >39.00)
LDL Cholesterol: 97 mg/dL (ref 0–99)
NonHDL: 117.64
Total CHOL/HDL Ratio: 3
Triglycerides: 105 mg/dL (ref 0.0–149.0)
VLDL: 21 mg/dL (ref 0.0–40.0)

## 2022-08-16 LAB — CBC WITH DIFFERENTIAL/PLATELET
Basophils Absolute: 0.1 10*3/uL (ref 0.0–0.1)
Basophils Relative: 1.2 % (ref 0.0–3.0)
Eosinophils Absolute: 0.1 10*3/uL (ref 0.0–0.7)
Eosinophils Relative: 1.2 % (ref 0.0–5.0)
HCT: 43.1 % (ref 39.0–52.0)
Hemoglobin: 14.7 g/dL (ref 13.0–17.0)
Lymphocytes Relative: 44.8 % (ref 12.0–46.0)
Lymphs Abs: 2.2 10*3/uL (ref 0.7–4.0)
MCHC: 34.2 g/dL (ref 30.0–36.0)
MCV: 97.6 fl (ref 78.0–100.0)
Monocytes Absolute: 0.5 10*3/uL (ref 0.1–1.0)
Monocytes Relative: 11 % (ref 3.0–12.0)
Neutro Abs: 2 10*3/uL (ref 1.4–7.7)
Neutrophils Relative %: 41.8 % — ABNORMAL LOW (ref 43.0–77.0)
Platelets: 253 10*3/uL (ref 150.0–400.0)
RBC: 4.42 Mil/uL (ref 4.22–5.81)
RDW: 12.6 % (ref 11.5–15.5)
WBC: 4.8 10*3/uL (ref 4.0–10.5)

## 2022-08-16 LAB — BASIC METABOLIC PANEL
BUN: 10 mg/dL (ref 6–23)
CO2: 23 mEq/L (ref 19–32)
Calcium: 9.6 mg/dL (ref 8.4–10.5)
Chloride: 101 mEq/L (ref 96–112)
Creatinine, Ser: 0.89 mg/dL (ref 0.40–1.50)
GFR: 93.38 mL/min (ref >60.00)
Glucose, Bld: 91 mg/dL (ref 70–99)
Potassium: 4.8 mEq/L (ref 3.5–5.1)
Sodium: 137 mEq/L (ref 135–145)

## 2022-08-16 LAB — HEPATIC FUNCTION PANEL
ALT: 16 U/L (ref 0–53)
AST: 19 U/L (ref 0–37)
Albumin: 4.8 g/dL (ref 3.5–5.2)
Alkaline Phosphatase: 41 U/L (ref 39–117)
Bilirubin, Direct: 0.1 mg/dL (ref 0.0–0.3)
Total Bilirubin: 0.5 mg/dL (ref 0.2–1.2)
Total Protein: 7.2 g/dL (ref 6.0–8.3)

## 2022-08-16 LAB — PSA: PSA: 1.56 ng/mL (ref 0.10–4.00)

## 2022-08-16 NOTE — Progress Notes (Signed)
Established Patient Office Visit  Subjective   Patient ID: Darrell Romero, male    DOB: [birth year]  Age: 62 y.o. MRN: [id number]  Chief Complaint  Patient presents with   Annual Exam    HPI   Darrell Romero is here for complete physical.  His wife had a stroke several years ago and has apparently some early dementia.  She still lives independently but Darrell Romero likely require increasing care soon.  He is looking at possible retirement later this year.  He has no chronic medical problems.  Takes no regular medications.  He is looking at having cataract surgery soon.  Health maintenance reviewed  -No history of shingles vaccine.  Undecided at this time. -Colonoscopy 11/22 with recommended 7-year follow-up  Social history-married with 4 grown children and 15 grandchildren.  Continues to work for [employer].  Quit smoking 1984.  Drinks 1-2 beers per day.  Family history-somewhat vague. His mom died when she was age 28 reportedly of some type of seizure complication and possible stroke. He is not sure origin of stroke. He was basically placed on [facility] at age 40. His father history is somewhat sketchy but apparently he died age 29 of a cerebral aneurysm. He does not have clear knowledge and his mom had a cerebral aneurysm. No other known first-degree relatives with cerebral aneurysm .  He has 2 sisters 1 older and 1 younger both in good health  Past Medical History:  Diagnosis Date   Cataract    RIGHT EYE and LEFT EYE   Chicken pox    GERD (gastroesophageal reflux disease)    H. pylori infection    Heart murmur    AS A CHILD   Hyperlipidemia    diet controlled , no meds   Past Surgical History:  Procedure Laterality Date   COLONOSCOPY     KNEE ARTHROSCOPY Left 1998, 1999   POLYPECTOMY      reports that he quit smoking about 40 years ago. His smoking use included cigarettes. He has never used smokeless tobacco. He reports current alcohol use. He reports that he does not use  drugs. family history includes Anuerysm in his father; Arthritis in his father; Heart disease in his paternal aunt; Neuropathy in his father; Stroke (age of onset: 36) in his mother. No Known Allergies  Review of Systems  Constitutional:  Negative for chills, fever, malaise/fatigue and weight loss.  HENT:  Negative for hearing loss.   Eyes:  Negative for blurred vision and double vision.  Respiratory:  Negative for cough and shortness of breath.   Cardiovascular:  Negative for chest pain, palpitations and leg swelling.  Gastrointestinal:  Negative for abdominal pain, blood in stool, constipation and diarrhea.  Genitourinary:  Negative for dysuria.  Skin:  Negative for rash.  Neurological:  Negative for dizziness, speech change, seizures, loss of consciousness and headaches.  Psychiatric/Behavioral:  Negative for depression.       Objective:     BP 110/60 (BP Location: Left Arm, Patient Position: Sitting, Cuff Size: Normal)   Pulse 71   Temp 99 F (37.2 C) (Oral)   Ht 5' 6.54" (1.69 m)   Wt 163 lb 12.8 oz (74.3 kg)   SpO2 98%   BMI 26.01 kg/m    Physical Exam Vitals reviewed.  Constitutional:      General: He is not in acute distress.    Appearance: Normal appearance. He is well-developed.  HENT:     Head: Normocephalic and atraumatic.  Right Ear: External ear normal.     Left Ear: External ear normal.  Eyes:     Conjunctiva/sclera: Conjunctivae normal.     Pupils: Pupils are equal, round, and reactive to light.  Neck:     Thyroid: No thyromegaly.  Cardiovascular:     Rate and Rhythm: Normal rate and regular rhythm.     Heart sounds: Normal heart sounds. No murmur heard. Pulmonary:     Effort: No respiratory distress.     Breath sounds: No wheezing or rales.  Abdominal:     General: Bowel sounds are normal. There is no distension.     Palpations: Abdomen is soft. There is no mass.     Tenderness: There is no abdominal tenderness. There is no guarding or  rebound.  Musculoskeletal:     Cervical back: Normal range of motion and neck supple.     Right lower leg: No edema.     Left lower leg: No edema.  Lymphadenopathy:     Cervical: No cervical adenopathy.  Skin:    Findings: No rash.  Neurological:     Mental Status: He is alert and oriented to person, place, and time.     Cranial Nerves: No cranial nerve deficit.      No results found for any visits on 08/16/22.    The 10-year ASCVD risk score (Arnett DK, et al., 2019) is: 4.7%    Assessment & Plan:   Problem List Items Addressed This Visit   None Visit Diagnoses     Physical exam    -  Primary   Relevant Orders   Basic metabolic panel   Lipid panel   CBC with Differential/Platelet   Hepatic function panel   PSA     Generally healthy 62 year old male.  No chronic medical problems.  Takes no regular medications.  We discussed several health maintenance items  -Consider Shingrix vaccine.  He Darrell Romero check on insurance coverage -Consider annual flu vaccine -Check screening labs as above -Colonoscopy up-to-date -Commend minimum of 150 minutes of moderate intensity aerobic exercise per week  No follow-ups on file.    Evelena Peat, MD

## 2022-08-16 NOTE — Patient Instructions (Signed)
Consider Shingles vaccine- check on insurance coverage.

## 2023-08-17 ENCOUNTER — Encounter: Admitting: Family Medicine

## 2023-08-21 ENCOUNTER — Encounter: Payer: Self-pay | Admitting: Family Medicine

## 2023-08-21 ENCOUNTER — Ambulatory Visit (INDEPENDENT_AMBULATORY_CARE_PROVIDER_SITE_OTHER): Admitting: Family Medicine

## 2023-08-21 VITALS — BP 120/62 | HR 56 | Temp 98.2°F | Ht 66.93 in | Wt 161.5 lb

## 2023-08-21 DIAGNOSIS — Z125 Encounter for screening for malignant neoplasm of prostate: Secondary | ICD-10-CM | POA: Diagnosis not present

## 2023-08-21 DIAGNOSIS — Z23 Encounter for immunization: Secondary | ICD-10-CM | POA: Diagnosis not present

## 2023-08-21 DIAGNOSIS — Z13220 Encounter for screening for lipoid disorders: Secondary | ICD-10-CM

## 2023-08-21 DIAGNOSIS — Z0000 Encounter for general adult medical examination without abnormal findings: Secondary | ICD-10-CM | POA: Diagnosis not present

## 2023-08-21 NOTE — Progress Notes (Signed)
 Established Patient Office Visit  Subjective   Patient ID: Darrell Romero, male    DOB: 1960/06/21  Age: 63 y.o. MRN: [id number]  Chief Complaint  Patient presents with   Annual Exam    HPI   Will is seen for physical exam.  His wife has dementia.  She is also had prior stroke.  She is age 66 and apparently her dementia has progressed quite a bit this past year.  She is becoming more dependent on care.  He is looking at possible early retirement this next year.  He is currently working 30 hours/week.  Generally healthy.  Takes no regular medications.  Quit smoking several years ago.  Health maintenance reviewed:  Health Maintenance  Topic Date Due   HIV Screening  Never done   COVID-19 Vaccine (3 - 2024-25 season) 11/12/2022   Zoster Vaccines- Shingrix (1 of 2) 11/21/2023 (Originally 06/22/2012)   INFLUENZA VACCINE  10/12/2023   DTaP/Tdap/Td (2 - Td or Tdap) 10/11/2025   Colonoscopy  01/15/2028   Hepatitis C Screening  Completed   HPV VACCINES  Aged Out   Meningococcal B Vaccine  Aged Out   - He does agree to getting pneumonia vaccine. -Declines Shingrix vaccine -Colonoscopy up-to-date  Social history-married.  As above, wife has advanced Alzheimer's dementia.  He has 4 children of his own and 1 stepchild.  15 grandchildren.  Works for [employer] at possible retirement later this year.  Quit smoking 1984.  Drinks 1-2 beers per day.  Family history-mother had stroke in early age and also seizure complications.  She died in early age.  He was placed in an orphanage at age 30.  His father died age 2 of cerebral aneurysm.  He has couple sisters and half brother.  Sister with COPD but she had a long history of smoking. Review of Systems  Constitutional:  Negative for chills, fever, malaise/fatigue and weight loss.  HENT:  Negative for hearing loss.   Eyes:  Negative for blurred vision and double vision.  Respiratory:  Negative for cough and shortness of  breath.   Cardiovascular:  Negative for chest pain, palpitations and leg swelling.  Gastrointestinal:  Negative for abdominal pain, blood in stool, constipation and diarrhea.  Genitourinary:  Negative for dysuria.  Skin:  Negative for rash.  Neurological:  Negative for dizziness, speech change, seizures, loss of consciousness and headaches.  Psychiatric/Behavioral:  Negative for depression.       Objective:     BP 120/62 (BP Location: Left Arm, Patient Position: Sitting, Cuff Size: Normal)   Pulse (!) 56   Temp 98.2 F (36.8 C) (Oral)   Ht 5' 6.93" (1.7 m)   Wt 161 lb 8 oz (73.3 kg)   SpO2 96%   BMI 25.35 kg/m  BP Readings from Last 3 Encounters:  08/21/23 120/62  08/16/22 110/60  05/03/22 124/60   Wt Readings from Last 3 Encounters:  08/21/23 161 lb 8 oz (73.3 kg)  08/16/22 163 lb 12.8 oz (74.3 kg)  05/03/22 165 lb 9.6 oz (75.1 kg)      Physical Exam Vitals reviewed.  Constitutional:      General: He is not in acute distress.    Appearance: He is well-developed.  HENT:     Head: Normocephalic and atraumatic.     Left Ear: External ear normal.     Ears:     Comments: Does have some cerumen right canal but not obstructing. Eyes:     Conjunctiva/sclera:  Conjunctivae normal.     Pupils: Pupils are equal, round, and reactive to light.  Neck:     Thyroid : No thyromegaly.  Cardiovascular:     Rate and Rhythm: Normal rate and regular rhythm.     Heart sounds: Normal heart sounds. No murmur heard. Pulmonary:     Effort: No respiratory distress.     Breath sounds: No wheezing or rales.  Abdominal:     General: Bowel sounds are normal. There is no distension.     Palpations: Abdomen is soft. There is no mass.     Tenderness: There is no abdominal tenderness. There is no guarding or rebound.  Musculoskeletal:     Cervical back: Normal range of motion and neck supple.     Right lower leg: No edema.     Left lower leg: No edema.  Lymphadenopathy:     Cervical: No  cervical adenopathy.  Skin:    Findings: No rash.  Neurological:     Mental Status: He is alert and oriented to person, place, and time.     Cranial Nerves: No cranial nerve deficit.      No results found for any visits on 08/21/23.    The 10-year ASCVD risk score (Arnett DK, et al., 2019) is: 6.4%    Assessment & Plan:   Problem List Items Addressed This Visit   None Visit Diagnoses       Physical exam    -  Primary   Relevant Orders   PSA   Basic metabolic panel with GFR   Lipid panel   CBC with Differential/Platelet   Hepatic function panel   TSH     Generally healthy 63 year old male.  No chronic medical problems.  We discussed age-appropriate health maintenance items.  He declines shingles vaccine.  Does agree to Prevnar 20.  Consider annual flu vaccine.  Obtain labs as above.  He has great challenges right now with regular exercise with basically 24/7 care for his wife along with work.  He is looking at retirement later this year.  No follow-ups on file.    Glean Lamy, MD

## 2023-08-21 NOTE — Addendum Note (Signed)
 Addended by: Aurelio Leer on: 08/21/2023 08:52 AM   Modules accepted: Orders

## 2023-08-24 ENCOUNTER — Telehealth: Payer: Self-pay

## 2023-08-24 ENCOUNTER — Ambulatory Visit: Payer: Self-pay | Admitting: Family Medicine

## 2023-08-24 NOTE — Telephone Encounter (Signed)
 Copied from CRM [id number]. Topic: Clinical - Lab/Test Results >> Aug 24, 2023  3:55 PM Adonis Hoot wrote: Reason for CRM: Patient returned call to [facility] regarding lab results.Lab message from provider was relayed to patient. Patient verbalized understanding

## 2023-08-27 NOTE — Telephone Encounter (Signed)
 Noted   please see result note

## 2023-09-04 ENCOUNTER — Telehealth: Payer: Self-pay | Admitting: Family Medicine

## 2023-09-04 NOTE — Telephone Encounter (Signed)
 Copied from CRM [id number]. Topic: Clinical - Medication Refill >> Sep 04, 2023 11:17 AM Mesmerise C wrote: Medication: famciclovir  (FAMVIR ) 500 MG tablet   Has the patient contacted their pharmacy? No (Agent: If no, request that the patient contact the pharmacy for the refill. If patient does not wish to contact the pharmacy document the reason why and proceed with request.) (Agent: If yes, when and what did the pharmacy advise?) Patient is out doesn't use too often uses for emergencies  This is the patient's preferred pharmacy:  [facility] [phone] - [place], [place] - [address] [place] [address] Phone: [phone] Fax: [phone]  Is this the correct pharmacy for this prescription? Yes If no, delete pharmacy and type the correct one.   Has the prescription been filled recently? No  Is the patient out of the medication? Yes  Has the patient been seen for an appointment in the last year OR does the patient have an upcoming appointment? Yes  Can we respond through MyChart? Yes  Agent: Please be advised that Rx refills may take up to 3 business days. We ask that you follow-up with your pharmacy.

## 2023-09-05 MED ORDER — FAMCICLOVIR 500 MG PO TABS: ORAL_TABLET | ORAL | 1 refills | Status: AC

## 2023-09-05 NOTE — Addendum Note (Signed)
 Addended by: METTA KRISTEN CROME on: 09/05/2023 02:05 PM   Modules accepted: Orders

## 2023-09-05 NOTE — Telephone Encounter (Signed)
 Rx sent.

## 2023-09-05 NOTE — Addendum Note (Signed)
 Addended by: METTA KRISTEN CROME on: 09/05/2023 01:21 PM   Modules accepted: Orders

## 2023-09-05 NOTE — Telephone Encounter (Signed)
 I spoke with the patient and he reported he uses requested medication for fever blister as needed. Patient reported that he can take OTC medication if PCP has any recommendations he can try.
